# Patient Record
Sex: Female | Born: 1980 | Race: Black or African American | Hispanic: No | Smoking: Former smoker
Health system: Southern US, Community
[De-identification: ages and names within clinical notes are randomized; demographics above are authoritative.]

## PROBLEM LIST (undated history)

## (undated) DIAGNOSIS — B3731 Acute candidiasis of vulva and vagina: Secondary | ICD-10-CM

## (undated) DIAGNOSIS — A749 Chlamydial infection, unspecified: Secondary | ICD-10-CM

## (undated) DIAGNOSIS — D219 Benign neoplasm of connective and other soft tissue, unspecified: Secondary | ICD-10-CM

## (undated) DIAGNOSIS — R1900 Intra-abdominal and pelvic swelling, mass and lump, unspecified site: Secondary | ICD-10-CM

## (undated) DIAGNOSIS — B373 Candidiasis of vulva and vagina: Secondary | ICD-10-CM

## (undated) DIAGNOSIS — B9689 Other specified bacterial agents as the cause of diseases classified elsewhere: Secondary | ICD-10-CM

## (undated) DIAGNOSIS — N760 Acute vaginitis: Secondary | ICD-10-CM

## (undated) DIAGNOSIS — Z8489 Family history of other specified conditions: Secondary | ICD-10-CM

## (undated) HISTORY — PX: TUBAL LIGATION: SHX77

---
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
## 1999-03-22 ENCOUNTER — Encounter: Admission: RE | Admit: 1999-03-22 | Discharge: 1999-03-22 | Payer: Self-pay | Admitting: Family Medicine

## 1999-06-16 ENCOUNTER — Encounter: Admission: RE | Admit: 1999-06-16 | Discharge: 1999-06-16 | Payer: Self-pay | Admitting: Family Medicine

## 1999-06-30 ENCOUNTER — Encounter: Admission: RE | Admit: 1999-06-30 | Discharge: 1999-06-30 | Payer: Self-pay | Admitting: Family Medicine

## 2000-04-19 ENCOUNTER — Inpatient Hospital Stay (HOSPITAL_COMMUNITY): Admission: AD | Admit: 2000-04-19 | Discharge: 2000-04-19 | Payer: Self-pay | Admitting: *Deleted

## 2000-06-24 ENCOUNTER — Ambulatory Visit (HOSPITAL_COMMUNITY): Admission: RE | Admit: 2000-06-24 | Discharge: 2000-06-24 | Payer: Self-pay | Admitting: *Deleted

## 2000-11-23 ENCOUNTER — Inpatient Hospital Stay (HOSPITAL_COMMUNITY): Admission: AD | Admit: 2000-11-23 | Discharge: 2000-11-23 | Payer: Self-pay | Admitting: *Deleted

## 2000-11-24 ENCOUNTER — Inpatient Hospital Stay (HOSPITAL_COMMUNITY): Admission: AD | Admit: 2000-11-24 | Discharge: 2000-11-27 | Payer: Self-pay | Admitting: *Deleted

## 2001-08-12 ENCOUNTER — Encounter: Admission: RE | Admit: 2001-08-12 | Discharge: 2001-08-12 | Payer: Self-pay | Admitting: Family Medicine

## 2002-01-30 ENCOUNTER — Encounter: Admission: RE | Admit: 2002-01-30 | Discharge: 2002-01-30 | Payer: Self-pay | Admitting: Family Medicine

## 2002-10-24 ENCOUNTER — Encounter (INDEPENDENT_AMBULATORY_CARE_PROVIDER_SITE_OTHER): Payer: Self-pay | Admitting: *Deleted

## 2002-10-29 ENCOUNTER — Other Ambulatory Visit: Admission: RE | Admit: 2002-10-29 | Discharge: 2002-10-29 | Payer: Self-pay | Admitting: Family Medicine

## 2002-10-29 ENCOUNTER — Encounter: Admission: RE | Admit: 2002-10-29 | Discharge: 2002-10-29 | Payer: Self-pay | Admitting: Family Medicine

## 2003-08-17 ENCOUNTER — Other Ambulatory Visit: Admission: RE | Admit: 2003-08-17 | Discharge: 2003-08-17 | Payer: Self-pay | Admitting: Obstetrics and Gynecology

## 2003-08-18 ENCOUNTER — Other Ambulatory Visit: Admission: RE | Admit: 2003-08-18 | Discharge: 2003-08-18 | Payer: Self-pay | Admitting: Obstetrics and Gynecology

## 2004-01-20 ENCOUNTER — Inpatient Hospital Stay (HOSPITAL_COMMUNITY): Admission: AD | Admit: 2004-01-20 | Discharge: 2004-01-20 | Payer: Self-pay | Admitting: Obstetrics and Gynecology

## 2004-02-19 ENCOUNTER — Inpatient Hospital Stay (HOSPITAL_COMMUNITY): Admission: AD | Admit: 2004-02-19 | Discharge: 2004-02-20 | Payer: Self-pay | Admitting: Obstetrics and Gynecology

## 2004-03-03 ENCOUNTER — Inpatient Hospital Stay (HOSPITAL_COMMUNITY): Admission: AD | Admit: 2004-03-03 | Discharge: 2004-03-03 | Payer: Self-pay | Admitting: Obstetrics & Gynecology

## 2004-03-28 ENCOUNTER — Inpatient Hospital Stay (HOSPITAL_COMMUNITY): Admission: AD | Admit: 2004-03-28 | Discharge: 2004-03-28 | Payer: Self-pay | Admitting: Obstetrics and Gynecology

## 2004-04-10 ENCOUNTER — Inpatient Hospital Stay (HOSPITAL_COMMUNITY): Admission: RE | Admit: 2004-04-10 | Discharge: 2004-04-13 | Payer: Self-pay | Admitting: Obstetrics and Gynecology

## 2005-04-17 ENCOUNTER — Other Ambulatory Visit: Admission: RE | Admit: 2005-04-17 | Discharge: 2005-04-17 | Payer: Self-pay | Admitting: Obstetrics and Gynecology

## 2005-04-18 ENCOUNTER — Other Ambulatory Visit: Admission: RE | Admit: 2005-04-18 | Discharge: 2005-04-18 | Payer: Self-pay | Admitting: Obstetrics and Gynecology

## 2005-11-17 ENCOUNTER — Inpatient Hospital Stay (HOSPITAL_COMMUNITY): Admission: AD | Admit: 2005-11-17 | Discharge: 2005-11-17 | Payer: Self-pay | Admitting: Obstetrics and Gynecology

## 2005-11-19 ENCOUNTER — Inpatient Hospital Stay (HOSPITAL_COMMUNITY): Admission: AD | Admit: 2005-11-19 | Discharge: 2005-11-19 | Payer: Self-pay | Admitting: Obstetrics and Gynecology

## 2005-11-19 ENCOUNTER — Inpatient Hospital Stay (HOSPITAL_COMMUNITY): Admission: AD | Admit: 2005-11-19 | Discharge: 2005-11-21 | Payer: Self-pay | Admitting: Obstetrics and Gynecology

## 2006-01-01 ENCOUNTER — Ambulatory Visit (HOSPITAL_COMMUNITY): Admission: RE | Admit: 2006-01-01 | Discharge: 2006-01-01 | Payer: Self-pay | Admitting: Obstetrics and Gynecology

## 2006-01-07 ENCOUNTER — Encounter (INDEPENDENT_AMBULATORY_CARE_PROVIDER_SITE_OTHER): Payer: Self-pay | Admitting: *Deleted

## 2006-01-07 ENCOUNTER — Ambulatory Visit (HOSPITAL_COMMUNITY): Admission: RE | Admit: 2006-01-07 | Discharge: 2006-01-07 | Payer: Self-pay | Admitting: Obstetrics and Gynecology

## 2006-11-09 ENCOUNTER — Emergency Department (HOSPITAL_COMMUNITY): Admission: EM | Admit: 2006-11-09 | Discharge: 2006-11-09 | Payer: Self-pay | Admitting: Emergency Medicine

## 2007-02-20 DIAGNOSIS — E669 Obesity, unspecified: Secondary | ICD-10-CM

## 2007-02-21 ENCOUNTER — Encounter (INDEPENDENT_AMBULATORY_CARE_PROVIDER_SITE_OTHER): Payer: Self-pay | Admitting: *Deleted

## 2008-05-30 ENCOUNTER — Inpatient Hospital Stay (HOSPITAL_COMMUNITY): Admission: AD | Admit: 2008-05-30 | Discharge: 2008-05-30 | Payer: Self-pay | Admitting: Obstetrics and Gynecology

## 2008-09-09 ENCOUNTER — Emergency Department (HOSPITAL_COMMUNITY): Admission: EM | Admit: 2008-09-09 | Discharge: 2008-09-09 | Payer: Self-pay | Admitting: Family Medicine

## 2009-05-26 ENCOUNTER — Emergency Department (HOSPITAL_COMMUNITY): Admission: EM | Admit: 2009-05-26 | Discharge: 2009-05-26 | Payer: Self-pay | Admitting: Emergency Medicine

## 2009-10-13 ENCOUNTER — Emergency Department (HOSPITAL_COMMUNITY): Admission: EM | Admit: 2009-10-13 | Discharge: 2009-10-13 | Payer: Self-pay | Admitting: Emergency Medicine

## 2010-06-28 ENCOUNTER — Emergency Department (HOSPITAL_COMMUNITY): Admission: EM | Admit: 2010-06-28 | Discharge: 2010-06-28 | Payer: Self-pay | Admitting: Emergency Medicine

## 2010-07-19 ENCOUNTER — Emergency Department (HOSPITAL_COMMUNITY): Admission: EM | Admit: 2010-07-19 | Discharge: 2010-07-19 | Payer: Self-pay | Admitting: Emergency Medicine

## 2011-03-10 LAB — URINALYSIS, ROUTINE W REFLEX MICROSCOPIC
Bilirubin Urine: NEGATIVE
Glucose, UA: NEGATIVE mg/dL
Nitrite: NEGATIVE
Specific Gravity, Urine: 1.022 (ref 1.005–1.030)

## 2011-03-10 LAB — COMPREHENSIVE METABOLIC PANEL
AST: 30 U/L (ref 0–37)
Creatinine, Ser: 0.74 mg/dL (ref 0.4–1.2)
GFR calc Af Amer: >60 mL/min (ref >60)
GFR calc non Af Amer: >60 mL/min (ref >60)
Potassium: 3.3 mEq/L — ABNORMAL LOW (ref 3.5–5.1)
Sodium: 141 mEq/L (ref 135–145)
Total Protein: 6.9 g/dL (ref 6.0–8.3)

## 2011-03-10 LAB — CBC
HCT: 36.5 % (ref 36.0–46.0)
MCH: 31.1 pg (ref 26.0–34.0)
Platelets: 188 10*3/uL (ref 150–400)
RBC: 4.03 MIL/uL (ref 3.87–5.11)
WBC: 10.1 10*3/uL (ref 4.0–10.5)

## 2011-03-10 LAB — DIFFERENTIAL
Basophils Absolute: 0 10*3/uL (ref 0.0–0.1)
Basophils Relative: 0 % (ref 0–1)
Eosinophils Relative: 3 % (ref 0–5)
Lymphs Abs: 1.9 10*3/uL (ref 0.7–4.0)
Monocytes Absolute: 0.8 10*3/uL (ref 0.1–1.0)
Neutro Abs: 7.1 10*3/uL (ref 1.7–7.7)
Neutrophils Relative %: 71 % (ref 43–77)

## 2011-03-10 LAB — GC/CHLAMYDIA PROBE AMP, GENITAL
Chlamydia, DNA Probe: NEGATIVE
GC Probe Amp, Genital: NEGATIVE

## 2011-03-10 LAB — URINE MICROSCOPIC-ADD ON

## 2011-03-10 LAB — URINE CULTURE: Culture: NO GROWTH

## 2011-03-10 LAB — LIPASE, BLOOD: Lipase: 23 U/L (ref 11–59)

## 2011-03-10 LAB — WET PREP, GENITAL: Trich, Wet Prep: NONE SEEN

## 2011-05-11 NOTE — Op Note (Signed)
NAME:  Anna Schroeder, Anna Schroeder NO.:  [id number]   MEDICAL RECORD NO.:  [id number]          PATIENT TYPE:  AMB   LOCATION:  SDC                           [facility]ILITY:  [facility]   PHYSICIAN:  Malva Limes, M.D.    DATE OF BIRTH:  1981/08/09   DATE OF PROCEDURE:  01/07/2006  DATE OF DISCHARGE:                                 OPERATIVE REPORT   PREOPERATIVE DIAGNOSES:  1.  The patient desires permanent sterilization.  2.  Right ovarian dermoid cyst.   POSTOPERATIVE DIAGNOSES:  1.  The patient desires permanent sterilization.  2.  Right ovarian dermoid cyst.  3.  Omental adhesions.   SURGEON:  Malva Limes, M.D.   ASSISTANT:  Luvenia Redden, M.D.   ANTIBIOTICS:  Ancef 1 g.   ESTIMATED BLOOD LOSS:  Minimal.   SPECIMENS:  Right ovary dermoid sent to pathology.   COMPLICATIONS:  None.   DRAINS:  Red rubber catheter to bladder.   DESCRIPTION OF PROCEDURE:  The patient was taken to the operating room where  she was placed in the dorsal supine position.  A general anesthetic was  administered without complications.  She was then placed in the dorsal  lithotomy position. She was prepped with Betadine.  Her bladder was drained  with a red rubber catheter.  A Hulka tenaculum was applied to the anterior  cervical lip.  The umbilicus was then injected with 0.25% Marcaine.  A  vertical skin incision was made.  This was carried down to the fascia.  The  fascia was grasped with Kochers and opened with the Mayo scissors.  Parietal  peritoneum was entered sharply.  Sutures were placed laterally.  The Hasson  cannula placed in the peritoneal cavity and  3 L of carbon dioxide  insufflated.  The patient was then placed in Trendelenburg.  At this point a  5 mm port was placed in the suprapubic region under direct visualization and  also in the right lower quadrant under direct visualization.  The patient  was noted to have omental adhesions involving the uterus and anterior  abdominal  wall.  This was all taken down sharply.  Once this was  accomplished, examination of the fallopian tubes and ovaries was undertaken.  The patient was found to have 3 cm dermoid on the right ovary.  There was no  evidence of any endometriosis in the pelvis.  Uterus and fallopian tubes  were appeared to be normal.  Left ovary was normal.  At this point the  Filshie clips were set up and placed in the isthmic portion of both  fallopian tubes.  The clip was applied perpendicularly to the tube.  The  entire tube appeared to be within the clasp.  The clasp appeared to be  tightly closed.  At this point the right ovary was grasped and the serosa  opened.  The ovarian dermoid was shot out at the last moment.  The cyst was  entered and serous fluid was noted.  Suction device was placed into the cyst  and the contents withdrawn.  A hair  was seen.  The cyst was removed through  the 10 mm scope port.  At this point copious irrigation was performed.  There was no evidence of any current bleeding.  At this point the procedure  was concluded.  The patient instruments were removed.  The pneumoperitoneum  was released.  No evidence of bleeding was seen from the port sites.  The 5  mm ports were closed with Dermabond.  The umbilical port was closed with an  0 Vicryl suture and 4-0 Vicryl suture.  The patient tolerated the procedure  well.  She was taken to the recovery room in stable condition.  Instrument  and lap counts were correct x1.   The patient will be discharged to home.  She was given Percocet to take  p.r.n.Marland Kitchen  She will follow up in the office in four weeks.           ______________________________  Malva Limes, M.D.     MA/MEDQ  D:  01/07/2006  T:  01/08/2006  Job:  [id number]

## 2011-05-11 NOTE — Discharge Summary (Signed)
NAME:  Anna Schroeder, Anna Schroeder                            ACCOUNT NO.:  [id number]   MEDICAL RECORD NO.:  [id number]                   PATIENT TYPE:  INP   LOCATION:  9126                                 [facility]ILITY[facility]   PHYSICIAN:  Miguel Aschoff, M.D.                    DATE OF BIRTH:  07-16-1981   DATE OF ADMISSION:  04/10/2004  DATE OF DISCHARGE:  04/13/2004                                 DISCHARGE SUMMARY   FINAL DIAGNOSES:  1. Intrauterine pregnancy at [redacted] weeks gestation.  2. Breech presentation.   PROCEDURE:  Primary low transverse cesarean section.   SURGEON:  Dr. Carrington Clamp.   ASSISTANT:  Dr. Conley Simmonds.   COMPLICATIONS:  None.   This 30 year old G2 P1-0-0-1 presents at term with a breech presentation.  The patient's antepartum course had been complicated by the patient with  sickle cell trait, the patient's first child has sickle cell disease and  unsure of father of the baby, therefore we are uncertain of the father-of-  the-baby's sickle cell status.  Otherwise, the patient's antepartum course  had been uncomplicated.  She was seen at 32 weeks at the [facility]  for some preterm uterine contractions and was given a shot of terbutaline  but did not have any preterm contractions after that point.  The patient's  group B strep culture was negative.  She is admitted at the hospital at this  time.  She was taken to the operating room by Dr. Conley Simmonds where a  primary low transverse cesarean section was performed with the delivery of a  7-pound 4-ounce female infant in the footling breech presentation.  The baby  had Apgars of 8 and 8.  The baby was sent to the nursery secondary to some  fluid in the pharynx.  Otherwise, the delivery went without complications.  The patient's postoperative course was benign without complications.  The  patient was felt ready for discharge on postoperative day #3.  She was sent  home on a regular diet, told to decrease activities, told to  continue her  Chromagen iron one daily, was given Tylox one to two q.4h. as needed for  pain, told to follow up in the office in 4-6 weeks.   LABORATORY DATA ON DISCHARGE:  The patient had a hemoglobin of 9.6, white  blood cell count 13.3.     Leilani Able, P.A.-C.                Miguel Aschoff, M.D.    MB/MEDQ  D:  05/08/2004  T:  05/08/2004  Job:  [id number]

## 2011-05-11 NOTE — Op Note (Signed)
NAME:  Anna Schroeder, Anna Schroeder NO.:  [id number]   MEDICAL RECORD NO.:  [id number]          PATIENT TYPE:  INP   LOCATION:  9135                          [facility]   PHYSICIAN:  Miguel Aschoff, M.D.       DATE OF BIRTH:  1980-12-27   DATE OF PROCEDURE:  11/20/2005  DATE OF DISCHARGE:                                 OPERATIVE REPORT   PREOPERATIVE DIAGNOSIS:  Desired sterilization.   POSTOPERATIVE DIAGNOSIS:  Failed attempt at postpartum tubal sterilization  secondary to omental adhesions, intraperitoneal fluid and fat.   SURGEON:  Miguel Aschoff, M.D.   ANESTHESIA:  Epidural.   BRIEF HISTORY:  The patient is a 30 year old black female.  She delivered on  November 19, 2005.  The patient has had one previous cesarean section one  and two normal deliveries.  The patient has expressed the desire for a  permanent sterilization procedure and signed all the appropriate consents  for postpartum tubal sterilization.  After informed consent, she was taken  to the operating room to have this procedure done.   PROCEDURE:  After satisfactory level of anesthesia was achieved, the patient  was placed in supine position, prepped and draped in the usual fashion.  An  infraumbilical incision was made.  This incision was extended down through  the subcutaneous tissue until the fascia was identified.  The fascia was  then incised transversely, revealing the peritoneum.  The peritoneum was  then entered.  There was a large volume of intraperitoneal fluid, which kept  seeping through the infraumbilical incision.  It was necessary to use pool  suction to evacuate this fluid in an attempt to allow visualization.  Using  multiple retractors, it was still difficult to bring the adnexa into view.  There was a band of adhesions covering the anterior surface of the uterus,  making this difficult.  The attempt continued on multiple tries but it was  never possible to visualize either tube or grasp  either tube and bring it  through the infraumbilical incision.  After approximately 20 minutes of  trying, still with the omentum and fluid interfering with the ability to  perform this procedure, it was elected to abandon this attempt and schedule  the patient for an outpatient laparoscopic tubal in the future.  The patient  was informed and agreed to cessation of the procedure at this point.  The  fascia was identified, grasped with Allis clamps and closed using running  interlocking 0 Vicryl suture.  The subcutaneous tissue was closed using  interrupted 0 Vicryl suture.  The skin incision was closed using  subcuticular 0 Vicryl suture, and then the site was injected with 0.25%  Marcaine, 10 mL were used.   Plan is for the patient be taken to the recovery room and returned to her  room.  She will be seen back in six weeks for postpartum check and at that  time scheduled for a laparoscopic tubal sterilization      Miguel Aschoff, M.D.  Electronically Signed    AR/MEDQ  D:  11/20/2005  T:  11/20/2005  Job:  [id number]

## 2011-05-11 NOTE — Op Note (Signed)
NAME:  Anna Schroeder, Anna Schroeder                            ACCOUNT NO.:  [id number]   MEDICAL RECORD NO.:  [id number]                   PATIENT TYPE:  INP   LOCATION:  9126                                 [facility]   PHYSICIAN:  Carrington Clamp, M.D.              DATE OF BIRTH:  1981/07/01   DATE OF PROCEDURE:  04/10/2004  DATE OF DISCHARGE:                                 OPERATIVE REPORT   PREOPERATIVE DIAGNOSIS:  Breech presentation at term pregnancy.   POSTOPERATIVE DIAGNOSIS:  Breech presentation at term pregnancy.   PROCEDURE:  Primary low transverse cesarean section.   SURGEON:  Carrington Clamp, M.D.   ASSISTANT:  Randye Lobo, M.D.   ANESTHESIA:  Spinal.   ESTIMATED BLOOD LOSS:  700 cc.   URINE OUTPUT:  200 cc.   INTRAVENOUS FLUIDS:  3400 cc.   COMPLICATIONS:  None.   FINDINGS:  1. A female infant, footling breech presentation, Apgars 8 and 8.  The baby     was sent to the nursery secondary to some fluid in the pharynx.  2. There were normal tubes, ovaries, and uterus seen.   MEDICATIONS:  Ancef, Pitocin, and __________ .   PATHOLOGY:  None.   TECHNIQUE:  After adequate spinal anesthesia was achieved, the patient was  prepped and draped in the usual sterile fashion in the dorsal supine  position with leftward tilt.  A Pfannenstiel skin incision was made with a  scalpel and carried down to the fascia with the Bovie cautery.  The fascia  was incised in the midline with the scalpel and carried in a transverse  curvilinear manner with the Mayo scissors.  The fascia was reflected  superiorly and inferiorly from the rectus muscle, and the rectus muscle was  split in the midline.  The peritoneum was entered bluntly, and the  peritoneum was then incised in the superior and inferior manner with the  Metzenbaum scissors.   The bladder blade was placed, and the vesicouterine fascia tented up and  incised in a transverse curvilinear to create the bladder flap with blunt  dissection.  The bladder blade was replaced and a 2-cm incision was made in  the upper portion of the lower uterine segment transversely until the amnion  could be seen.  The bandage scissors were used to extend the incision in a  transverse curvilinear manner, and Allis clamps were used to rupture the  membranes.  The baby was identified in the breech presentation and delivered  without complication.  The baby was bulb suctioned, and the cord was clamped  and cut.  The baby was handed to awaiting pediatrics.  The placenta was  delivered manually and the uterus exteriorized, wrapped in a wet lap, and  cleared of all debris.  The uterine incision was closed with a running  locked stitch of 0 Monocryl.  An imbricating layer of 0 Monocryl was then  used.  Two figure-of-eight stitches were then used to ensure hemostasis.  The uterus was reapproximated in the abdomen, and the abdomen was irrigated  with saline.  The uterine incision was re-inspected and found to be  hemostatic.  There was one other additional stitch done to ensure  hemostasis.  The peritoneum was then closed with a running stitch of 2-0  Vicryl.  The fascia was closed with a running stitch of 0 Vicryl.  The  subcutaneous tissue was  rendered hemostatic with Bovie cautery and  irrigation, then closed with interrupted stitches of 2-0 plain gut.  The  skin was closed with staples.  The patient tolerated the procedure well and  was returned to the recovery room in stable condition.                                               Carrington Clamp, M.D.    MH/MEDQ  D:  04/10/2004  T:  04/10/2004  Job:  [id number]

## 2011-09-20 LAB — POCT PREGNANCY, URINE: Operator id: 13440

## 2012-03-09 ENCOUNTER — Emergency Department (INDEPENDENT_AMBULATORY_CARE_PROVIDER_SITE_OTHER)
Admission: EM | Admit: 2012-03-09 | Discharge: 2012-03-09 | Disposition: A | Discharge status: Home / Self Care | Payer: Self-pay | Source: Home / Self Care | Attending: Emergency Medicine | Admitting: Emergency Medicine

## 2012-03-09 ENCOUNTER — Encounter (HOSPITAL_COMMUNITY): Payer: Self-pay | Admitting: Emergency Medicine

## 2012-03-09 DIAGNOSIS — N72 Inflammatory disease of cervix uteri: Secondary | ICD-10-CM

## 2012-03-09 HISTORY — DX: Chlamydial infection, unspecified: A74.9

## 2012-03-09 HISTORY — DX: Other specified bacterial agents as the cause of diseases classified elsewhere: N76.0

## 2012-03-09 HISTORY — DX: Acute candidiasis of vulva and vagina: B37.31

## 2012-03-09 HISTORY — DX: Candidiasis of vulva and vagina: B37.3

## 2012-03-09 HISTORY — DX: Other specified bacterial agents as the cause of diseases classified elsewhere: B96.89

## 2012-03-09 HISTORY — DX: Acute vaginitis: N76.0

## 2012-03-09 HISTORY — DX: Benign neoplasm of connective and other soft tissue, unspecified: D21.9

## 2012-03-09 LAB — POCT URINALYSIS DIP (DEVICE)
Hgb urine dipstick: NEGATIVE
Specific Gravity, Urine: >=1.03 (ref 1.005–1.030)
pH: 5.5 (ref 5.0–8.0)

## 2012-03-09 LAB — WET PREP, GENITAL

## 2012-03-09 MED ORDER — CEFTRIAXONE SODIUM 250 MG IJ SOLR
INTRAMUSCULAR | Status: AC
  Filled 2012-03-09: qty 250

## 2012-03-09 MED ORDER — LIDOCAINE HCL (PF) 1 % IJ SOLN
INTRAMUSCULAR | Status: AC
  Filled 2012-03-09: qty 5

## 2012-03-09 MED ORDER — AZITHROMYCIN 250 MG PO TABS
ORAL_TABLET | ORAL | Status: AC
  Filled 2012-03-09: qty 4

## 2012-03-09 MED ORDER — AZITHROMYCIN 250 MG PO TABS
1000.0000 mg | ORAL_TABLET | Freq: Once | ORAL | Status: AC
  Administered 2012-03-09: 1000 mg via ORAL

## 2012-03-09 MED ORDER — NAPROXEN 500 MG PO TABS: 500.0000 mg | ORAL_TABLET | Freq: Two times a day (BID) | ORAL | Status: DC

## 2012-03-09 MED ORDER — CEFTRIAXONE SODIUM 250 MG IJ SOLR
250.0000 mg | Freq: Once | INTRAMUSCULAR | Status: AC
  Administered 2012-03-09: 250 mg via INTRAMUSCULAR

## 2012-03-09 NOTE — ED Notes (Signed)
Pt has low abd pain, low back pain and white vaginal discharge for 4 days.

## 2012-03-09 NOTE — ED Provider Notes (Signed)
History     CSN: [id number]  Arrival date & time 03/09/12  9147   First MD Initiated Contact with Patient 03/09/12 [phone]      Chief Complaint  Patient presents with  . Abdominal Pain    (Consider location/radiation/quality/duration/timing/severity/associated sxs/prior treatment) HPI Comments: Pt with 1 week nonoderous vaginal discharge. Patient also reports sharp, constant midline and left lower quadrant pain 5 days ago. States that the pain was constant on the first day, and has gradually gotten better,  Pain was not related with movement, eating, fasting, urination, defecation. Reports some vaginal spotting, and occasional back pain.. Patient's menses were from March 6 to the 10th, and she states that the irregular vaginal bleeding is unusual for her. No urgency, frequency, dysuria, oderous urine, hematuria,  genital blisters, vaginal itching. No fevers, N/V, other abdominal pain. No recent abx use. Pt sexually active with same female partner. Does not know if he is having any symptoms.STD's  a concern today. Has a history of BV, chlamydia, yeast infection. History of left ovarian cyst, uterine fibroids. No h/o gonorrhea, Trichomonas, syphilis, herpes, HIV.   ROS as noted in HPI. All other ROS negative.    Patient is a 31 y.o. female presenting with vaginal discharge. The history is provided by the patient. No language interpreter was used.  Vaginal Discharge This is a new problem. The current episode started more than 1 week ago. The problem occurs constantly. The problem has been gradually worsening. Associated symptoms include abdominal pain. The symptoms are aggravated by intercourse. The symptoms are relieved by NSAIDs. Treatments tried: Aleve. The treatment provided mild relief.    Past Medical History  Diagnosis Date  . Chlamydia   . Fibroids   . BV (bacterial vaginosis)   . Vaginal yeast infection     Past Surgical History  Procedure Date  . Tubal ligation     History  reviewed. No pertinent family history.  History  Substance Use Topics  . Smoking status: Current Everyday Smoker -- 0.5 packs/day  . Smokeless tobacco: Not on file  . Alcohol Use: No    OB History    Grav Para Term Preterm Abortions TAB SAB Ect Mult Living                  Review of Systems  Gastrointestinal: Positive for abdominal pain.  Genitourinary: Positive for vaginal discharge.    Allergies  Review of patient's allergies indicates no known allergies.  Home Medications   Current Outpatient Rx  Name Route Sig Dispense Refill  . NAPROXEN SODIUM 220 MG PO TABS Oral Take 220 mg by mouth 2 (two) times daily with a meal.    . NAPROXEN 500 MG PO TABS Oral Take 1 tablet (500 mg total) by mouth 2 (two) times daily. 20 tablet 0    BP 135/75  Pulse 85  Temp(Src) 98.7 F (37.1 C) (Oral)  Resp 16  SpO2 100%  LMP 02/27/2012  Physical Exam  Nursing note and vitals reviewed. Constitutional: She is oriented to person, place, and time. She appears well-developed and well-nourished. No distress.  HENT:  Head: Normocephalic and atraumatic.  Eyes: Conjunctivae and EOM are normal. No scleral icterus.  Neck: Normal range of motion.  Cardiovascular: Normal rate, regular rhythm and normal heart sounds.   Pulmonary/Chest: Effort normal and breath sounds normal.  Abdominal: Soft. Normal appearance and bowel sounds are normal. She exhibits no distension. There is no hepatomegaly. There is tenderness in the suprapubic area. There is no  rebound, no guarding and no CVA tenderness.  Genitourinary: Pelvic exam was performed with patient supine. There is no rash on the right labia. There is no rash on the left labia. Uterus is tender. Cervix exhibits no motion tenderness and no friability. Right adnexum displays no mass, no tenderness and no fullness. Left adnexum displays no mass and no fullness. No erythema, tenderness or bleeding around the vagina. No foreign body around the vagina. Vaginal  discharge found.       Thin white non-oderous  vaginal d/c. Uterine tenderness, mild left adnexal tenderness. Chaperone present during exam  Musculoskeletal: Normal range of motion.  Neurological: She is alert and oriented to person, place, and time.  Skin: Skin is warm and dry.  Psychiatric: She has a normal mood and affect. Her behavior is normal. Judgment and thought content normal.    ED Course  Procedures (including critical care time)  Labs Reviewed  POCT URINALYSIS DIP (DEVICE) - Abnormal; Notable for the following:    Bilirubin Urine MODERATE (*)    Ketones, ur TRACE (*)    Protein, ur 30 (*)    All other components within normal limits  WET PREP, GENITAL - Abnormal; Notable for the following:    Clue Cells Wet Prep HPF POC FEW (*)    WBC, Wet Prep HPF POC FEW (*)    All other components within normal limits  POCT PREGNANCY, URINE  GC/CHLAMYDIA PROBE AMP, GENITAL  LAB REPORT - SCANNED   No results found.   1. Cervicitis     Results for orders placed during the hospital encounter of 03/09/12  POCT URINALYSIS DIP (DEVICE)      Component Value Range   Glucose, UA NEGATIVE  NEGATIVE (mg/dL)   Bilirubin Urine MODERATE (*) NEGATIVE    Ketones, ur TRACE (*) NEGATIVE (mg/dL)   Specific Gravity, Urine >=1.030  1.005 - 1.030    Hgb urine dipstick NEGATIVE  NEGATIVE    pH 5.5  5.0 - 8.0    Protein, ur 30 (*) NEGATIVE (mg/dL)   Urobilinogen, UA 1.0  0.0 - 1.0 (mg/dL)   Nitrite NEGATIVE  NEGATIVE    Leukocytes, UA NEGATIVE  NEGATIVE   POCT PREGNANCY, URINE      Component Value Range   Preg Test, Ur NEGATIVE  NEGATIVE   WET PREP, GENITAL      Component Value Range   Yeast Wet Prep HPF POC NONE SEEN  NONE SEEN    Trich, Wet Prep NONE SEEN  NONE SEEN    Clue Cells Wet Prep HPF POC FEW (*) NONE SEEN    WBC, Wet Prep HPF POC FEW (*) NONE SEEN   GC/CHLAMYDIA PROBE AMP, GENITAL      Component Value Range   GC Probe Amp, Genital NEGATIVE  NEGATIVE    Chlamydia, DNA Probe  NEGATIVE  NEGATIVE      MDM  Previous charts, labs, imaging reviewed. Patient was last seen in the ER on 07/19/2010 for left lower pelvic pain. Pelvic ultrasound confirmed uterine fibroid. Patient noted to have a left ovarian cyst. Patient diagnosed with abdominal pain, bacterial vaginosis, uterine fibroids.  Udip noted. Patient's urine is extremely concentrated, and also has trace ketones, protein with this. Patient with no left upper quadrant pain, upper abdominal tenderness, jaundice, history of hepatitis, history of hemolytic anemias, recent travel. Discussed this result with patient, and will have her followup with a primary care physician of her choice to have this rechecked. Providing her with local primary  care resources. H&P most consistent with a cervicitis, not PID, ovarian torsion. Giving 250 mg of Rocephin and 1 g of azithromycin  as STDs are a concern today. Sent off GC, Chlamydia, wet prep. Advised patient to refrain from sexual contact and  to give [place] a working phone number so that we can contact her if needed  Luiz Blare, MD 03/12/12 1700

## 2012-03-09 NOTE — Discharge Instructions (Signed)
Take the medication as written. Give [place] a working phone number so that we can contact you if needed. Refrain from sexual contact until you know your results and your partner(s) are treated. Return if you get worse, have a fever >100.4, or for any concerns.   Go to www.goodrx.com to look up your medications. This will give you a list of where you can find your prescriptions at the most affordable prices.  Sheila Oats GUIDE  Insufficient Money for Medicine Contact [employer]:  call "211" or [facility] [phone].  No Primary Care Doctor Call [facility]  [phone] Other agencies that provide inexpensive medical care    [facility]  [phone]    [facility]  [phone]    [facility]  [phone]    [facility]  [phone] [address] [place] [address]    [facility]  [phone]    [facility]  [phone] [facility] [phone]   [address], [place] [place], [place] [address]  [facility] Resources  [facility]     [employer]                          [facility] Dept. [address] [place]                       [address]      [address] [place] [address]   [phone] (After Hours)

## 2012-03-10 LAB — GC/CHLAMYDIA PROBE AMP, GENITAL
Chlamydia, DNA Probe: NEGATIVE
GC Probe Amp, Genital: NEGATIVE

## 2012-11-13 ENCOUNTER — Encounter (HOSPITAL_COMMUNITY): Payer: Self-pay | Admitting: *Deleted

## 2012-11-13 ENCOUNTER — Other Ambulatory Visit (HOSPITAL_COMMUNITY)
Admission: RE | Admit: 2012-11-13 | Discharge: 2012-11-13 | Disposition: A | Discharge status: Home / Self Care | Payer: Medicaid Other | Source: Ambulatory Visit | Attending: Emergency Medicine | Admitting: Emergency Medicine

## 2012-11-13 ENCOUNTER — Emergency Department (HOSPITAL_COMMUNITY)
Admission: EM | Admit: 2012-11-13 | Discharge: 2012-11-13 | Disposition: A | Discharge status: Home / Self Care | Payer: Medicaid Other | Source: Home / Self Care | Attending: Emergency Medicine | Admitting: Emergency Medicine

## 2012-11-13 DIAGNOSIS — Z113 Encounter for screening for infections with a predominantly sexual mode of transmission: Secondary | ICD-10-CM | POA: Insufficient documentation

## 2012-11-13 DIAGNOSIS — N760 Acute vaginitis: Secondary | ICD-10-CM

## 2012-11-13 DIAGNOSIS — N946 Dysmenorrhea, unspecified: Secondary | ICD-10-CM

## 2012-11-13 MED ORDER — DICLOFENAC SODIUM 75 MG PO TBEC: 75.0000 mg | DELAYED_RELEASE_TABLET | Freq: Every day | ORAL | Status: AC

## 2012-11-13 MED ORDER — FLUCONAZOLE 100 MG PO TABS
100.0000 mg | ORAL_TABLET | Freq: Once | ORAL | Status: DC
Start: 2012-11-13 — End: 2013-10-18

## 2012-11-13 NOTE — ED Provider Notes (Signed)
History     CSN: [id number]  Arrival date & time 11/13/12  1252   First MD Initiated Contact with Patient 11/13/12 1257      Chief Complaint  Patient presents with  . Vaginal Discharge    (Consider location/radiation/quality/duration/timing/severity/associated sxs/prior treatment) HPI Comments: Patient presents urgent care describing she's been having a vaginal discharge for over a month. She was previously diagnosed with a yeast infection but felt that at the time was never so. She has started with her periods and cramping today. She also expressed some concern and she is " crossing her fingers" that her boyfriend has not been " fooling around". Patient denies any fevers, nausea vomiting or constant pelvic pain. Been having some colic pain.  Patient is a 31 y.o. female presenting with vaginal discharge. The history is provided by the patient.  Vaginal Discharge This is a recurrent problem. The current episode started more than 1 week ago. The problem occurs constantly. The problem has not changed since onset.Pertinent negatives include no abdominal pain and no shortness of breath. Nothing aggravates the symptoms. The treatment provided no relief.    Past Medical History  Diagnosis Date  . Chlamydia   . Fibroids   . BV (bacterial vaginosis)   . Vaginal yeast infection     Past Surgical History  Procedure Date  . Tubal ligation     History reviewed. No pertinent family history.  History  Substance Use Topics  . Smoking status: Current Every Day Smoker -- 0.5 packs/day  . Smokeless tobacco: Not on file  . Alcohol Use: No    OB History    Grav Para Term Preterm Abortions TAB SAB Ect Mult Living                  Review of Systems  Respiratory: Negative for shortness of breath.   Gastrointestinal: Negative for nausea, vomiting, abdominal pain and diarrhea.  Genitourinary: Positive for vaginal discharge.  Skin: Negative for rash.    Allergies  Review of patient's  allergies indicates no known allergies.  Home Medications   Current Outpatient Rx  Name  Route  Sig  Dispense  Refill  . DICLOFENAC SODIUM 75 MG PO TBEC   Oral   Take 1 tablet (75 mg total) by mouth daily.   7 tablet   0   . FLUCONAZOLE 100 MG PO TABS   Oral   Take 1 tablet (100 mg total) by mouth once.   2 tablet   0     BP 122/84  Pulse 94  Temp 98.6 F (37 C) (Oral)  Resp 16  SpO2 99%  LMP 10/30/2012  Physical Exam  Nursing note and vitals reviewed. Constitutional: She appears well-developed and well-nourished. No distress.  Pulmonary/Chest: Effort normal.  Abdominal: Soft. She exhibits no distension. There is no tenderness.  Neurological: She is alert.  Skin: No rash noted. No erythema.    ED Course  Procedures (including critical care time)   Labs Reviewed  CERVICOVAGINAL ANCILLARY ONLY   No results found.   1. Vaginitis and vulvovaginitis   2. Menstrual cramps       MDM  Vaginitis- Rx for diflucan        Jimmie Molly, MD 11/13/12 1539

## 2012-11-13 NOTE — ED Notes (Addendum)
Pt  Reports  Vaginal  Discharge   For  Over  Month      She  Reports  Had   A  Yeast  Infection        At  That time  That  She  States  Never  Resolved    She  Also  Reports  Some  Low  abd  Cramping        She  Walks         Upright  With a  Steady  Fluid  Gait          Speaking in  Complete  sentances  Is in no  Acute  distress

## 2012-11-19 ENCOUNTER — Telehealth (HOSPITAL_COMMUNITY): Payer: Self-pay | Admitting: *Deleted

## 2012-11-19 MED ORDER — METRONIDAZOLE 500 MG PO TABS: 500.0000 mg | ORAL_TABLET | Freq: Two times a day (BID) | ORAL | Status: DC

## 2013-04-07 ENCOUNTER — Emergency Department (INDEPENDENT_AMBULATORY_CARE_PROVIDER_SITE_OTHER)
Admission: EM | Admit: 2013-04-07 | Discharge: 2013-04-07 | Disposition: A | Discharge status: Home / Self Care | Payer: Medicaid Other | Source: Home / Self Care | Attending: Emergency Medicine | Admitting: Emergency Medicine

## 2013-04-07 ENCOUNTER — Encounter (HOSPITAL_COMMUNITY): Payer: Self-pay | Admitting: Emergency Medicine

## 2013-04-07 DIAGNOSIS — S335XXA Sprain of ligaments of lumbar spine, initial encounter: Secondary | ICD-10-CM

## 2013-04-07 DIAGNOSIS — S39012A Strain of muscle, fascia and tendon of lower back, initial encounter: Secondary | ICD-10-CM

## 2013-04-07 MED ORDER — KETOROLAC TROMETHAMINE 60 MG/2ML IM SOLN
60.0000 mg | Freq: Once | INTRAMUSCULAR | Status: AC
  Administered 2013-04-07: 60 mg via INTRAMUSCULAR

## 2013-04-07 MED ORDER — HYDROMORPHONE HCL PF 1 MG/ML IJ SOLN
2.0000 mg | Freq: Once | INTRAMUSCULAR | Status: AC
  Administered 2013-04-07: 2 mg via INTRAMUSCULAR

## 2013-04-07 MED ORDER — DICLOFENAC SODIUM 75 MG PO TBEC: 75.0000 mg | DELAYED_RELEASE_TABLET | Freq: Two times a day (BID) | ORAL | Status: DC

## 2013-04-07 MED ORDER — OXYCODONE-ACETAMINOPHEN 5-325 MG PO TABS: ORAL_TABLET | ORAL | Status: DC

## 2013-04-07 MED ORDER — ONDANSETRON 4 MG PO TBDP
ORAL_TABLET | ORAL | Status: AC
  Filled 2013-04-07: qty 2

## 2013-04-07 MED ORDER — CYCLOBENZAPRINE HCL 5 MG PO TABS: 5.0000 mg | ORAL_TABLET | Freq: Three times a day (TID) | ORAL | Status: DC | PRN

## 2013-04-07 MED ORDER — ONDANSETRON 4 MG PO TBDP
8.0000 mg | ORAL_TABLET | Freq: Once | ORAL | Status: AC
  Administered 2013-04-07: 8 mg via ORAL

## 2013-04-07 MED ORDER — HYDROMORPHONE HCL PF 1 MG/ML IJ SOLN
INTRAMUSCULAR | Status: AC
  Filled 2013-04-07: qty 2

## 2013-04-07 MED ORDER — KETOROLAC TROMETHAMINE 60 MG/2ML IM SOLN
INTRAMUSCULAR | Status: AC
  Filled 2013-04-07: qty 2

## 2013-04-07 NOTE — ED Provider Notes (Signed)
Chief Complaint:   Chief Complaint  Patient presents with  . Back Pain    History of Present Illness:   Anna Schroeder is a 32 year old female who has had a three-day history of a severe, excruciating, right lower back pain without radiation. She denies any injury or performing any activity that might have caused this. It does seem to come on its own. The pain does not radiate down the leg and has been no numbness, tingling, or muscle weakness. She denies any dysuria, frequency, hematuria, incontinence of urine, urinary retention, or incontinence of bowel. She's had no abdominal pain, nausea, or vomiting. She denies any fever, chills, headache, stiff neck, unintentional weight loss, or cancer history. She has had no prior history of back problems.  Review of Systems:  Other than noted above, the patient denies any of the following symptoms: Systemic:  No fever, chills, severe fatigue, or unexplained weight loss. GI:  No abdominal pain, nausea, vomiting, diarrhea, constipation, incontinence of bowel, or blood in stool. GU:  No dysuria, frequency, urgency, or hematuria. No incontinence of urine or difficulty urinating.  M-S:  No neck pain, joint pain, arthritis, or myalgias. Neuro:  No paresthesias, saddle anesthesia, muscular weakness, or progressive neurological deficit.  PMFSH:  Past medical history, family history, social history, meds, and allergies were reviewed. Specifically, there is no history of cancer, major trauma, osteoporosis, immunosuppression, HIV, or IV or injection drug use.   Physical Exam:   Vital signs:  BP 129/93  Pulse 67  Temp(Src) 97.9 F (36.6 C) (Oral)  Resp 16  SpO2 100% General:  Alert, oriented, in in severe distress due to her back pain. Abdomen:  Soft, non-tender.  No organomegaly or mass.  No pulsatile midline abdominal mass or bruit. Back:  Right lower back was extremely tender to palpation and she cannot stand to have attached all. Likewise she cannot stand to  move or even sit up straight in a chair. Straight leg raising was positive with pain referred to the back but not down the legs. Neuro:  Normal muscle strength, sensations and DTRs. Extremities: Pedal pulses were full, there was no edema. Skin:  Clear, warm and dry.  No rash.  Course in Urgent Care Center:   She was given Dilaudid 2 mg IM, Toradol 60 mg IM, and Zofran ODT 8 mg sublingually.  Assessment:  The encounter diagnosis was Lumbar strain, initial encounter.  No evidence for HNP, vertebral compression fracture, or arthritis.  Plan:   1.  The following meds were prescribed:   Discharge Medication List as of 04/07/2013  6:47 PM    START taking these medications   Details  cyclobenzaprine (FLEXERIL) 5 MG tablet Take 1 tablet (5 mg total) by mouth 3 (three) times daily as needed for muscle spasms., Starting 04/07/2013, Until Discontinued, Normal    diclofenac (VOLTAREN) 75 MG EC tablet Take 1 tablet (75 mg total) by mouth 2 (two) times daily., Starting 04/07/2013, Until Discontinued, Normal    oxyCODONE-acetaminophen (PERCOCET) 5-325 MG per tablet 1 to 2 tablets every 6 hours as needed for pain., Print       2.  The patient was instructed in symptomatic care and handouts were given. 3.  The patient was told to return if becoming worse in any way, if no better in 2 weeks, and given some red flag symptoms including fever, neurological symptoms, or worsening of the pain that would indicate earlier return. 4.  The patient was encouraged to try to be as  active as possible and given some exercises to do followed by moist heat.    Reuben Likes, MD 04/07/13 2024

## 2013-04-07 NOTE — ED Notes (Signed)
Back pain

## 2013-04-20 NOTE — Telephone Encounter (Signed)
errouneous error

## 2013-10-18 ENCOUNTER — Emergency Department (HOSPITAL_COMMUNITY)
Admission: EM | Admit: 2013-10-18 | Discharge: 2013-10-18 | Disposition: A | Discharge status: Home / Self Care | Payer: Medicaid Other | Source: Home / Self Care | Attending: Family Medicine | Admitting: Family Medicine

## 2013-10-18 ENCOUNTER — Encounter (HOSPITAL_COMMUNITY): Payer: Self-pay | Admitting: Emergency Medicine

## 2013-10-18 DIAGNOSIS — M545 Low back pain: Secondary | ICD-10-CM

## 2013-10-18 MED ORDER — TRAMADOL HCL 50 MG PO TABS: 50.0000 mg | ORAL_TABLET | Freq: Four times a day (QID) | ORAL | Status: DC | PRN

## 2013-10-18 MED ORDER — KETOROLAC TROMETHAMINE 60 MG/2ML IM SOLN
60.0000 mg | Freq: Once | INTRAMUSCULAR | Status: AC
  Administered 2013-10-18: 60 mg via INTRAMUSCULAR

## 2013-10-18 MED ORDER — CYCLOBENZAPRINE HCL 10 MG PO TABS: 10.0000 mg | ORAL_TABLET | Freq: Three times a day (TID) | ORAL | Status: DC | PRN

## 2013-10-18 MED ORDER — KETOROLAC TROMETHAMINE 60 MG/2ML IM SOLN
INTRAMUSCULAR | Status: AC
  Filled 2013-10-18: qty 2

## 2013-10-18 MED ORDER — DICLOFENAC SODIUM 75 MG PO TBEC: 75.0000 mg | DELAYED_RELEASE_TABLET | Freq: Two times a day (BID) | ORAL | Status: DC

## 2013-10-18 MED ORDER — METHYLPREDNISOLONE ACETATE 80 MG/ML IJ SUSP
INTRAMUSCULAR | Status: AC
  Filled 2013-10-18: qty 1

## 2013-10-18 MED ORDER — METHYLPREDNISOLONE ACETATE 80 MG/ML IJ SUSP
80.0000 mg | Freq: Once | INTRAMUSCULAR | Status: AC
  Administered 2013-10-18: 80 mg via INTRAMUSCULAR

## 2013-10-18 NOTE — ED Notes (Signed)
C/O back spasms approx 2-3x/yr.  Was using bathroom yesterday when she felt sudden onset right low back pain.  Area very tender to palpation.  Pain radiates into right buttock only when walking.  Has tried using heating pad.

## 2013-10-18 NOTE — ED Provider Notes (Signed)
Anna Schroeder is a 32 y.o. female who presents to Urgent Care today for right low back pain starting yesterday without injury. Patient denies any radiating pain weakness or numbness. The pain is severe and worse with activity better with rest. She has not tried any medications yet. She denies any difficulty walking or bowel bladder dysfunction. She feels well otherwise.   Past Medical History  Diagnosis Date  . Chlamydia   . Fibroids   . BV (bacterial vaginosis)   . Vaginal yeast infection    History  Substance Use Topics  . Smoking status: Current Every Day Smoker -- 0.50 packs/day  . Smokeless tobacco: Not on file  . Alcohol Use: No   ROS as above Medications reviewed. No current facility-administered medications for this encounter.   Current Outpatient Prescriptions  Medication Sig Dispense Refill  . cyclobenzaprine (FLEXERIL) 10 MG tablet Take 1 tablet (10 mg total) by mouth 3 (three) times daily as needed for muscle spasms.  30 tablet  0  . diclofenac (VOLTAREN) 75 MG EC tablet Take 1 tablet (75 mg total) by mouth 2 (two) times daily.  60 tablet  0  . traMADol (ULTRAM) 50 MG tablet Take 1 tablet (50 mg total) by mouth every 6 (six) hours as needed for pain.  15 tablet  0    Exam:  BP 113/74  Pulse 66  Temp(Src) 98 F (36.7 C) (Oral)  Resp 18  SpO2 100%  LMP 10/15/2013 Gen: Well NAD Back: Nontender to spinal midline. Tender to palpation right SI joint. Range of motion is intact flexion lateral flexion rotation. Pain with extension. Strength is intact bilateral lower extremities. Patient is in the bilateral toes heels and can squat normally, get on and off exam table, and has a normal gait.   sensation is intact bilateral lower extremities.  No results found for this or any previous visit (from the past 24 hour(s)). No results found.  Assessment and Plan: 32 y.o. female with lumbago. Plan to treat with IM injection Toradol and Depo-Medrol. Oral management with  diclofenac, Flexeril, and tramadol.  Heating pad and home exercise program. Followup with sports medicine if not improving. Discussed warning signs or symptoms. Please see discharge instructions. Patient expresses understanding.      Rodolph Bong, MD 10/18/13 [phone]

## 2014-02-04 ENCOUNTER — Encounter (HOSPITAL_COMMUNITY): Payer: Self-pay | Admitting: Emergency Medicine

## 2014-02-04 ENCOUNTER — Emergency Department (HOSPITAL_COMMUNITY)
Admission: EM | Admit: 2014-02-04 | Discharge: 2014-02-04 | Disposition: A | Discharge status: Home / Self Care | Payer: Medicaid Other | Source: Home / Self Care

## 2014-02-04 DIAGNOSIS — R519 Headache, unspecified: Secondary | ICD-10-CM

## 2014-02-04 DIAGNOSIS — M62838 Other muscle spasm: Secondary | ICD-10-CM

## 2014-02-04 DIAGNOSIS — R55 Syncope and collapse: Secondary | ICD-10-CM

## 2014-02-04 DIAGNOSIS — R51 Headache: Secondary | ICD-10-CM

## 2014-02-04 MED ORDER — CYCLOBENZAPRINE HCL 10 MG PO TABS: 10.0000 mg | ORAL_TABLET | Freq: Three times a day (TID) | ORAL | Status: DC | PRN

## 2014-02-04 MED ORDER — MELOXICAM 15 MG PO TABS: 15.0000 mg | ORAL_TABLET | Freq: Every day | ORAL | Status: DC

## 2014-02-04 NOTE — ED Provider Notes (Signed)
Medical screening examination/treatment/procedure(s) were performed by a resident physician or non-physician practitioner and as the supervising physician I was immediately available for consultation/collaboration.  Lynne Leader, MD    Gregor Hams, MD 02/04/14 2112

## 2014-02-04 NOTE — ED Provider Notes (Signed)
CSN: [id number]     Arrival date & time 02/04/14  1539 History   None    Chief Complaint  Patient presents with  . Headache     (Consider location/radiation/quality/duration/timing/severity/associated sxs/prior Treatment) HPI  Headache: started yesterday. Fell Tuesday night. Felt lightheaded Tuesday night after coffee, felt nausaues walked outside adn then passed out. Did not hit head. EMS was called and stated pt was fine. Denies fevers, n/v/d, tongue biting, loss of bowel or bladder function, vertigo, palpitations, CP, SOB. H/o occasional HA every few months. HA is dull, comes and goes. Primarily at back of head. Denies photo/phonophobia. Has not taken anything.    Past Medical History  Diagnosis Date  . Chlamydia   . Fibroids   . BV (bacterial vaginosis)   . Vaginal yeast infection    Past Surgical History  Procedure Laterality Date  . Tubal ligation     History reviewed. No pertinent family history. History  Substance Use Topics  . Smoking status: Current Every Day Smoker -- 0.50 packs/day  . Smokeless tobacco: Not on file  . Alcohol Use: No   OB History   Grav Para Term Preterm Abortions TAB SAB Ect Mult Living                 Review of Systems  Constitutional: Negative for activity change.  Respiratory: Negative for chest tightness, shortness of breath and wheezing.   Cardiovascular: Negative for chest pain and palpitations.  Neurological: Positive for light-headedness and headaches. Negative for dizziness, tremors, seizures, syncope, facial asymmetry, speech difficulty, weakness and numbness.  All other systems reviewed and are negative.      Allergies  Review of patient's allergies indicates no known allergies.  Home Medications   Current Outpatient Rx  Name  Route  Sig  Dispense  Refill  . cyclobenzaprine (FLEXERIL) 10 MG tablet   Oral   Take 1 tablet (10 mg total) by mouth 3 (three) times daily as needed for muscle spasms.   30 tablet   0   .  diclofenac (VOLTAREN) 75 MG EC tablet   Oral   Take 1 tablet (75 mg total) by mouth 2 (two) times daily.   60 tablet   0   . meloxicam (MOBIC) 15 MG tablet   Oral   Take 1 tablet (15 mg total) by mouth daily.   30 tablet   0   . traMADol (ULTRAM) 50 MG tablet   Oral   Take 1 tablet (50 mg total) by mouth every 6 (six) hours as needed for pain.   15 tablet   0    BP 129/84  Pulse 73  Temp(Src) 97.9 F (36.6 C) (Oral)  Resp 20  SpO2 100%  LMP 01/04/2014 Physical Exam  Constitutional: She is oriented to person, place, and time. She appears well-developed and well-nourished. No distress.  HENT:  Head: Normocephalic and atraumatic.  Eyes: EOM are normal. Pupils are equal, round, and reactive to light.  Opthalmic exam w/o evidence of crisp disc margins  Neck: Normal range of motion.  Cardiovascular: Normal rate, regular rhythm, normal heart sounds and intact distal pulses.  Exam reveals no gallop and no friction rub.   No murmur heard. Pulmonary/Chest: Effort normal and breath sounds normal. No respiratory distress. She has no wheezes. She has no rales. She exhibits no tenderness.  Abdominal: Soft. She exhibits no distension.  Musculoskeletal:  Posterior neck muscles ttp but w/ FROM w/ slow movement. No rigidity.   Neurological: She is  alert and oriented to person, place, and time. No cranial nerve deficit. Coordination normal.  Skin: Skin is warm. No rash noted. She is not diaphoretic.  Psychiatric: She has a normal mood and affect. Her behavior is normal. Judgment and thought content normal.    ED Course  Procedures (including critical care time) Labs Review Labs Reviewed - No data to display Imaging Review No results found.    MDM   Final diagnoses:  Headache  Muscle spasm  Syncopal episodes    33yo AAF w/ HA likely secondary to muscle strain from fall.  - Flexeril and meloxicam for HA  Cause of syncope unclear but vasovagal most likely etiology. EKG nml.  No sign of intracranial process. Possibly due to medications/recreational. Unlikely neurologically based - pt to f/u w/ PCP/neuro if episodes continue  Linna Darner, MD Family Medicine PGY-3 02/04/2014, 5:20 PM      Waldemar Dickens, MD 02/04/14 [place], MD 02/04/14 [phone]

## 2014-02-04 NOTE — ED Notes (Signed)
Pt stated that on Tuesday night she got dizzy and fainted. She is having headaches, back spasms, rt arm pain, and neck pain. Pain is 7/10. No medications tried for relief. Written by: Lenore Manner, SMA

## 2014-02-04 NOTE — Discharge Instructions (Signed)
The cause of your headache is likely from tight muscles in your neck Please do daily range of motion exercises  Please start the flexeril and meloxicam to help with the neck spasms and headache Please follow up with your regular doctor if you continue to have passing out episodes These are all likely due to whats called a vasovagal response.

## 2014-05-26 ENCOUNTER — Emergency Department (HOSPITAL_COMMUNITY)
Admission: EM | Admit: 2014-05-26 | Discharge: 2014-05-26 | Disposition: A | Discharge status: Home / Self Care | Payer: Self-pay | Source: Home / Self Care

## 2014-05-26 ENCOUNTER — Encounter (HOSPITAL_COMMUNITY): Payer: Self-pay | Admitting: Emergency Medicine

## 2014-05-26 DIAGNOSIS — K089 Disorder of teeth and supporting structures, unspecified: Secondary | ICD-10-CM

## 2014-05-26 DIAGNOSIS — W57XXXA Bitten or stung by nonvenomous insect and other nonvenomous arthropods, initial encounter: Secondary | ICD-10-CM

## 2014-05-26 DIAGNOSIS — S80862A Insect bite (nonvenomous), left lower leg, initial encounter: Secondary | ICD-10-CM

## 2014-05-26 DIAGNOSIS — K0889 Other specified disorders of teeth and supporting structures: Secondary | ICD-10-CM

## 2014-05-26 DIAGNOSIS — K047 Periapical abscess without sinus: Secondary | ICD-10-CM

## 2014-05-26 DIAGNOSIS — S90569A Insect bite (nonvenomous), unspecified ankle, initial encounter: Secondary | ICD-10-CM

## 2014-05-26 MED ORDER — HYDROCODONE-ACETAMINOPHEN 5-325 MG PO TABS: 1.0000 | ORAL_TABLET | ORAL | Status: DC | PRN

## 2014-05-26 MED ORDER — AMOXICILLIN 500 MG PO CAPS: 1000.0000 mg | ORAL_CAPSULE | Freq: Two times a day (BID) | ORAL | Status: DC

## 2014-05-26 NOTE — ED Provider Notes (Signed)
CSN: [id number]     Arrival date & time 05/26/14  1357 History   First MD Initiated Contact with Patient 05/26/14 1501     Chief Complaint  Patient presents with  . Dental Problem   (Consider location/radiation/quality/duration/timing/severity/associated sxs/prior Treatment) HPI Comments: Toothache L upper tooth    Past Medical History  Diagnosis Date  . Chlamydia   . Fibroids   . BV (bacterial vaginosis)   . Vaginal yeast infection    Past Surgical History  Procedure Laterality Date  . Tubal ligation     No family history on file. History  Substance Use Topics  . Smoking status: Current Every Day Smoker -- 0.50 packs/day  . Smokeless tobacco: Not on file  . Alcohol Use: No   OB History   Grav Para Term Preterm Abortions TAB SAB Ect Mult Living                 Review of Systems  All other systems reviewed and are negative.   Allergies  Review of patient's allergies indicates no known allergies.  Home Medications   Prior to Admission medications   Medication Sig Start Date End Date Taking? Authorizing Provider  amoxicillin (AMOXIL) 500 MG capsule Take 2 capsules (1,000 mg total) by mouth 2 (two) times daily. 05/26/14   Janne Napoleon, NP  cyclobenzaprine (FLEXERIL) 10 MG tablet Take 1 tablet (10 mg total) by mouth 3 (three) times daily as needed for muscle spasms. 02/04/14   Waldemar Dickens, MD  diclofenac (VOLTAREN) 75 MG EC tablet Take 1 tablet (75 mg total) by mouth 2 (two) times daily. 10/18/13   Gregor Hams, MD  HYDROcodone-acetaminophen (NORCO/VICODIN) 5-325 MG per tablet Take 1 tablet by mouth every 4 (four) hours as needed. 05/26/14   Janne Napoleon, NP  meloxicam (MOBIC) 15 MG tablet Take 1 tablet (15 mg total) by mouth daily. 02/04/14   Waldemar Dickens, MD  traMADol (ULTRAM) 50 MG tablet Take 1 tablet (50 mg total) by mouth every 6 (six) hours as needed for pain. 10/18/13   Gregor Hams, MD   BP 131/83  Pulse 90  Temp(Src) 98.2 F (36.8 C) (Oral)  Resp 18  SpO2  100% Physical Exam  Nursing note and vitals reviewed. Constitutional: She is oriented to person, place, and time. She appears well-developed and well-nourished. No distress.  HENT:  L upper 1st molar with gingival erythema and small abscess. Tooth with multiple fractures and pulp erosion. Mild facial swelling.  Neurological: She is alert and oriented to person, place, and time.  Skin: Skin is warm and dry.    ED Course  Procedures (including critical care time) Labs Review Labs Reviewed - No data to display  Imaging Review No results found.   MDM   1. Dental abscess   2. Pain, dental   3. Tick bite of left lower leg     norco 5 mg #15 Amoxicillin The L leg tick bite without redness, swelling. Healing well.     Janne Napoleon, NP 05/26/14 1511

## 2014-05-26 NOTE — ED Notes (Signed)
C/o bad tooth and tick bite

## 2014-05-26 NOTE — Discharge Instructions (Signed)
Abscessed Tooth An abscessed tooth is an infection around your tooth. It may be caused by holes or damage to the tooth (cavity) or a dental disease. An abscessed tooth causes mild to very bad pain in and around the tooth. See your dentist right away if you have tooth or gum pain. HOME CARE  Take your medicine as told. Finish it even if you start to feel better.  Do not drive after taking pain medicine.  Rinse your mouth (gargle) often with salt water ( teaspoon salt in 8 ounces of warm water).  Do not apply heat to the outside of your face. GET HELP RIGHT AWAY IF:   You have a temperature by mouth above 102 F (38.9 C), not controlled by medicine.  You have chills and a very bad headache.  You have problems breathing or swallowing.  Your mouth will not open.  You develop puffiness (swelling) on the neck or around the eye.  Your pain is not helped by medicine.  Your pain is getting worse instead of better. MAKE SURE YOU:   Understand these instructions.  Will watch your condition.  Will get help right away if you are not doing well or get worse. Document Released: 05/28/2008 Document Revised: 03/03/2012 Document Reviewed: 03/20/2011 [facility] Patient Information 2014 [place].  Dental Abscess A dental abscess is a collection of infected fluid (pus) from a bacterial infection in the inner part of the tooth (pulp). It usually occurs at the end of the tooth's root.  CAUSES   Severe tooth decay.  Trauma to the tooth that allows bacteria to enter into the pulp, such as a broken or chipped tooth. SYMPTOMS   Severe pain in and around the infected tooth.  Swelling and redness around the abscessed tooth or in the mouth or face.  Tenderness.  Pus drainage.  Bad breath.  Bitter taste in the mouth.  Difficulty swallowing.  Difficulty opening the mouth.  Nausea.  Vomiting.  Chills.  Swollen neck glands. DIAGNOSIS   A medical and dental history will be  taken.  An examination will be performed by tapping on the abscessed tooth.  X-rays may be taken of the tooth to identify the abscess. TREATMENT The goal of treatment is to eliminate the infection. You may be prescribed antibiotic medicine to stop the infection from spreading. A root canal may be performed to save the tooth. If the tooth cannot be saved, it may be pulled (extracted) and the abscess may be drained.  HOME CARE INSTRUCTIONS  Only take over-the-counter or prescription medicines for pain, fever, or discomfort as directed by your caregiver.  Rinse your mouth (gargle) often with salt water ( tsp salt in 8 oz [250 ml] of warm water) to relieve pain or swelling.  Do not drive after taking pain medicine (narcotics).  Do not apply heat to the outside of your face.  Return to your dentist for further treatment as directed. SEEK MEDICAL CARE IF:  Your pain is not helped by medicine.  Your pain is getting worse instead of better. SEEK IMMEDIATE MEDICAL CARE IF:  You have a fever or persistent symptoms for more than 2 3 days.  You have a fever and your symptoms suddenly get worse.  You have chills or a very bad headache.  You have problems breathing or swallowing.  You have trouble opening your mouth.  You have swelling in the neck or around the eye. Document Released: 12/10/2005 Document Revised: 09/03/2012 Document Reviewed: 03/20/2011 [employer] Patient Information  2014 [place], [place].  Dental Care and Dentist Visits Dental care supports good overall health. Regular dental visits can also help you avoid dental pain, bleeding, infection, and other more serious health problems in the future. It is important to keep the mouth healthy because diseases in the teeth, gums, and other oral tissues can spread to other areas of the body. Some problems, such as diabetes, heart disease, and pre-term labor have been associated with poor oral health.  See your dentist every 6 months.  If you experience emergency problems such as a toothache or broken tooth, go to the dentist right away. If you see your dentist regularly, you may catch problems early. It is easier to be treated for problems in the early stages.  WHAT TO EXPECT AT A DENTIST VISIT  Your dentist will look for many common oral health problems and recommend proper treatment. At your regular dental visit, you can expect:  Gentle cleaning of the teeth and gums. This includes scraping and polishing. This helps to remove the sticky substance around the teeth and gums (plaque). Plaque forms in the mouth shortly after eating. Over time, plaque hardens on the teeth as tartar. If tartar is not removed regularly, it can cause problems. Cleaning also helps remove stains.  Periodic X-rays. These pictures of the teeth and supporting bone will help your dentist assess the health of your teeth.  Periodic fluoride treatments. Fluoride is a natural mineral shown to help strengthen teeth. Fluoride treatmentinvolves applying a fluoride gel or varnish to the teeth. It is most commonly done in children.  Examination of the mouth, tongue, jaws, teeth, and gums to look for any oral health problems, such as:  Cavities (dental caries). This is decay on the tooth caused by plaque, sugar, and acid in the mouth. It is best to catch a cavity when it is small.  Inflammation of the gums caused by plaque buildup (gingivitis).  Problems with the mouth or malformed or misaligned teeth.  Oral cancer or other diseases of the soft tissues or jaws. KEEP YOUR TEETH AND GUMS HEALTHY For healthy teeth and gums, follow these general guidelines as well as your dentist's specific advice:  Have your teeth professionally cleaned at the dentist every 6 months.  Brush twice daily with a fluoride toothpaste.  Floss your teeth daily.  Ask your dentist if you need fluoride supplements, treatments, or fluoride toothpaste.  Eat a healthy diet. Reduce  foods and drinks with added sugar.  Avoid smoking. TREATMENT FOR ORAL HEALTH PROBLEMS If you have oral health problems, treatment varies depending on the conditions present in your teeth and gums.  Your caregiver will most likely recommend good oral hygiene at each visit.  For cavities, gingivitis, or other oral health disease, your caregiver will perform a procedure to treat the problem. This is typically done at a separate appointment. Sometimes your caregiver will refer you to another dental specialist for specific tooth problems or for surgery. SEEK IMMEDIATE DENTAL CARE IF:  You have pain, bleeding, or soreness in the gum, tooth, jaw, or mouth area.  A permanent tooth becomes loose or separated from the gum socket.  You experience a blow or injury to the mouth or jaw area. Document Released: 08/22/2011 Document Revised: 03/03/2012 Document Reviewed: 08/22/2011 [facility] Patient Information 2014 [place], [place].

## 2014-05-27 NOTE — ED Provider Notes (Signed)
Medical screening examination/treatment/procedure(s) were performed by a resident physician or non-physician practitioner and as the supervising physician I was immediately available for consultation/collaboration.  Lynne Leader, MD    Gregor Hams, MD 05/27/14 [phone]

## 2014-08-22 ENCOUNTER — Encounter (HOSPITAL_COMMUNITY): Payer: Self-pay | Admitting: Emergency Medicine

## 2014-08-22 ENCOUNTER — Emergency Department (HOSPITAL_COMMUNITY): Payer: Medicaid Other

## 2014-08-22 ENCOUNTER — Emergency Department (HOSPITAL_COMMUNITY)
Admission: EM | Admit: 2014-08-22 | Discharge: 2014-08-22 | Disposition: A | Discharge status: Home / Self Care | Payer: Medicaid Other | Attending: Emergency Medicine | Admitting: Emergency Medicine

## 2014-08-22 DIAGNOSIS — F17200 Nicotine dependence, unspecified, uncomplicated: Secondary | ICD-10-CM | POA: Insufficient documentation

## 2014-08-22 DIAGNOSIS — R5381 Other malaise: Secondary | ICD-10-CM | POA: Diagnosis not present

## 2014-08-22 DIAGNOSIS — Z8742 Personal history of other diseases of the female genital tract: Secondary | ICD-10-CM | POA: Insufficient documentation

## 2014-08-22 DIAGNOSIS — R109 Unspecified abdominal pain: Secondary | ICD-10-CM | POA: Diagnosis present

## 2014-08-22 DIAGNOSIS — Z8619 Personal history of other infectious and parasitic diseases: Secondary | ICD-10-CM | POA: Insufficient documentation

## 2014-08-22 DIAGNOSIS — Z3202 Encounter for pregnancy test, result negative: Secondary | ICD-10-CM | POA: Diagnosis not present

## 2014-08-22 DIAGNOSIS — R5383 Other fatigue: Secondary | ICD-10-CM | POA: Diagnosis not present

## 2014-08-22 LAB — URINALYSIS, ROUTINE W REFLEX MICROSCOPIC
Bilirubin Urine: NEGATIVE
GLUCOSE, UA: NEGATIVE mg/dL
HGB URINE DIPSTICK: NEGATIVE
Ketones, ur: NEGATIVE mg/dL
Leukocytes, UA: NEGATIVE
Nitrite: NEGATIVE
Protein, ur: 30 mg/dL — AB
SPECIFIC GRAVITY, URINE: 1.024 (ref 1.005–1.030)
UROBILINOGEN UA: 1 mg/dL (ref 0.0–1.0)
pH: 8.5 — ABNORMAL HIGH (ref 5.0–8.0)

## 2014-08-22 LAB — POC URINE PREG, ED: PREG TEST UR: NEGATIVE

## 2014-08-22 LAB — CBC WITH DIFFERENTIAL/PLATELET
BASOS ABS: 0 10*3/uL (ref 0.0–0.1)
Basophils Relative: 0 % (ref 0–1)
EOS ABS: 0.3 10*3/uL (ref 0.0–0.7)
EOS PCT: 2 % (ref 0–5)
HCT: 40.3 % (ref 36.0–46.0)
Hemoglobin: 14.7 g/dL (ref 12.0–15.0)
LYMPHS PCT: 35 % (ref 12–46)
Lymphs Abs: 4.1 10*3/uL — ABNORMAL HIGH (ref 0.7–4.0)
MCH: 30.7 pg (ref 26.0–34.0)
MCHC: 36.5 g/dL — ABNORMAL HIGH (ref 30.0–36.0)
MCV: 84.1 fL (ref 78.0–100.0)
Monocytes Absolute: 0.4 10*3/uL (ref 0.1–1.0)
Monocytes Relative: 3 % (ref 3–12)
Neutro Abs: 7 10*3/uL (ref 1.7–7.7)
Neutrophils Relative %: 60 % (ref 43–77)
PLATELETS: 196 10*3/uL (ref 150–400)
RBC: 4.79 MIL/uL (ref 3.87–5.11)
RDW: 13.7 % (ref 11.5–15.5)
WBC: 11.8 10*3/uL — AB (ref 4.0–10.5)

## 2014-08-22 LAB — COMPREHENSIVE METABOLIC PANEL
ALBUMIN: 4.1 g/dL (ref 3.5–5.2)
ALT: 16 U/L (ref 0–35)
AST: 27 U/L (ref 0–37)
Alkaline Phosphatase: 60 U/L (ref 39–117)
Anion gap: 14 (ref 5–15)
BUN: 11 mg/dL (ref 6–23)
CALCIUM: 8.8 mg/dL (ref 8.4–10.5)
CO2: 20 mEq/L (ref 19–32)
Chloride: 105 mEq/L (ref 96–112)
Creatinine, Ser: 0.74 mg/dL (ref 0.50–1.10)
GFR calc Af Amer: >90 mL/min (ref >90)
GFR calc non Af Amer: >90 mL/min (ref >90)
Glucose, Bld: 96 mg/dL (ref 70–99)
Potassium: 3.9 mEq/L (ref 3.7–5.3)
SODIUM: 139 meq/L (ref 137–147)
TOTAL PROTEIN: 7.8 g/dL (ref 6.0–8.3)
Total Bilirubin: 0.8 mg/dL (ref 0.3–1.2)

## 2014-08-22 LAB — WET PREP, GENITAL
TRICH WET PREP: NONE SEEN
YEAST WET PREP: NONE SEEN

## 2014-08-22 LAB — URINE MICROSCOPIC-ADD ON

## 2014-08-22 LAB — I-STAT TROPONIN, ED: TROPONIN I, POC: 0 ng/mL (ref 0.00–0.08)

## 2014-08-22 MED ORDER — SODIUM CHLORIDE 0.9 % IV BOLUS (SEPSIS)
1000.0000 mL | Freq: Once | INTRAVENOUS | Status: AC
  Administered 2014-08-22: 1000 mL via INTRAVENOUS

## 2014-08-22 MED ORDER — IBUPROFEN 800 MG PO TABS: 800.0000 mg | ORAL_TABLET | Freq: Three times a day (TID) | ORAL | Status: DC

## 2014-08-22 MED ORDER — OXYCODONE-ACETAMINOPHEN 5-325 MG PO TABS
1.0000 | ORAL_TABLET | Freq: Once | ORAL | Status: AC
  Administered 2014-08-22: 1 via ORAL
  Filled 2014-08-22: qty 1

## 2014-08-22 MED ORDER — HYDROCODONE-ACETAMINOPHEN 5-325 MG PO TABS: 1.0000 | ORAL_TABLET | Freq: Four times a day (QID) | ORAL | Status: DC | PRN

## 2014-08-22 NOTE — ED Provider Notes (Signed)
CSN: [id number]     Arrival date & time 08/22/14  1733 History   First MD Initiated Contact with Patient 08/22/14 1827     Chief Complaint  Patient presents with  . Abdominal Cramping  . Weakness     (Consider location/radiation/quality/duration/timing/severity/associated sxs/prior Treatment) HPI Comments: Patient presents to the emergency department with chief complaint of lower abdominal pain, fatigue, and generalized weakness. She states that she had heavy menstrual cramps that started today. She states that she just started menstruating while using the bathroom in the waiting room. She denies any associated dysuria. Denies fevers, or chills.  Additionally, she states that earlier today she feel a little lightheaded, became sweaty, and a little shortness of breath. These are new symptoms for her. The symptoms have since resolved. She did not take anything for her symptoms. Nothing makes the symptoms better or worse.  The history is provided by the patient. No language interpreter was used.    Past Medical History  Diagnosis Date  . Chlamydia   . Fibroids   . BV (bacterial vaginosis)   . Vaginal yeast infection    Past Surgical History  Procedure Laterality Date  . Tubal ligation     History reviewed. No pertinent family history. History  Substance Use Topics  . Smoking status: Current Every Day Smoker -- 0.50 packs/day  . Smokeless tobacco: Not on file  . Alcohol Use: No   OB History   Grav Para Term Preterm Abortions TAB SAB Ect Mult Living                 Review of Systems  All other systems reviewed and are negative.     Allergies  Review of patient's allergies indicates no known allergies.  Home Medications   Prior to Admission medications   Medication Sig Start Date End Date Taking? Authorizing Provider  acetaminophen (TYLENOL) 500 MG tablet Take 1,000 mg by mouth every 6 (six) hours as needed for mild pain.   Yes Historical Provider, MD   BP 141/82   Pulse 57  Temp(Src) 97.8 F (36.6 C) (Oral)  Resp 18  SpO2 100%  LMP 08/22/2014 Physical Exam  Nursing note and vitals reviewed. Constitutional: She is oriented to person, place, and time. She appears well-developed and well-nourished.  HENT:  Head: Normocephalic and atraumatic.  Eyes: Conjunctivae and EOM are normal. Pupils are equal, round, and reactive to light.  Neck: Normal range of motion. Neck supple.  Cardiovascular: Normal rate and regular rhythm.  Exam reveals no gallop and no friction rub.   No murmur heard. Pulmonary/Chest: Effort normal and breath sounds normal. No respiratory distress. She has no wheezes. She has no rales. She exhibits no tenderness.  Abdominal: Soft. Bowel sounds are normal. She exhibits no distension and no mass. There is tenderness. There is no rebound and no guarding.  Moderate lower abdominal tenderness  Genitourinary:  Pelvic exam chaperoned by female ER tech, no right adnexa tenderness, moderate left adnexal tenderness, no uterine tenderness, no vaginal discharge, moderate bleeding, no CMT or friability, no foreign body, no injury to the external genitalia, no other significant findings   Musculoskeletal: Normal range of motion. She exhibits no edema and no tenderness.  Neurological: She is alert and oriented to person, place, and time.  Skin: Skin is warm and dry.  Psychiatric: She has a normal mood and affect. Her behavior is normal. Judgment and thought content normal.    ED Course  Procedures (including critical care time) Results for  orders placed during the hospital encounter of 08/22/14  WET PREP, GENITAL      Result Value Ref Range   Yeast Wet Prep HPF POC NONE SEEN  NONE SEEN   Trich, Wet Prep NONE SEEN  NONE SEEN   Clue Cells Wet Prep HPF POC FEW (*) NONE SEEN   WBC, Wet Prep HPF POC FEW (*) NONE SEEN  CBC WITH DIFFERENTIAL      Result Value Ref Range   WBC 11.8 (*) 4.0 - 10.5 K/uL   RBC 4.79  3.87 - 5.11 MIL/uL   Hemoglobin 14.7   12.0 - 15.0 g/dL   HCT 40.3  36.0 - 46.0 %   MCV 84.1  78.0 - 100.0 fL   MCH 30.7  26.0 - 34.0 pg   MCHC 36.5 (*) 30.0 - 36.0 g/dL   RDW 13.7  11.5 - 15.5 %   Platelets 196  150 - 400 K/uL   Neutrophils Relative % 60  43 - 77 %   Neutro Abs 7.0  1.7 - 7.7 K/uL   Lymphocytes Relative 35  12 - 46 %   Lymphs Abs 4.1 (*) 0.7 - 4.0 K/uL   Monocytes Relative 3  3 - 12 %   Monocytes Absolute 0.4  0.1 - 1.0 K/uL   Eosinophils Relative 2  0 - 5 %   Eosinophils Absolute 0.3  0.0 - 0.7 K/uL   Basophils Relative 0  0 - 1 %   Basophils Absolute 0.0  0.0 - 0.1 K/uL  COMPREHENSIVE METABOLIC PANEL      Result Value Ref Range   Sodium 139  137 - 147 mEq/L   Potassium 3.9  3.7 - 5.3 mEq/L   Chloride 105  96 - 112 mEq/L   CO2 20  19 - 32 mEq/L   Glucose, Bld 96  70 - 99 mg/dL   BUN 11  6 - 23 mg/dL   Creatinine, Ser 0.74  0.50 - 1.10 mg/dL   Calcium 8.8  8.4 - 10.5 mg/dL   Total Protein 7.8  6.0 - 8.3 g/dL   Albumin 4.1  3.5 - 5.2 g/dL   AST 27  0 - 37 U/L   ALT 16  0 - 35 U/L   Alkaline Phosphatase 60  39 - 117 U/L   Total Bilirubin 0.8  0.3 - 1.2 mg/dL   GFR calc non Af Amer >90  >90 mL/min   GFR calc Af Amer >90  >90 mL/min   Anion gap 14  5 - 15  URINALYSIS, ROUTINE W REFLEX MICROSCOPIC      Result Value Ref Range   Color, Urine YELLOW  YELLOW   APPearance TURBID (*) CLEAR   Specific Gravity, Urine 1.024  1.005 - 1.030   pH 8.5 (*) 5.0 - 8.0   Glucose, UA NEGATIVE  NEGATIVE mg/dL   Hgb urine dipstick NEGATIVE  NEGATIVE   Bilirubin Urine NEGATIVE  NEGATIVE   Ketones, ur NEGATIVE  NEGATIVE mg/dL   Protein, ur 30 (*) NEGATIVE mg/dL   Urobilinogen, UA 1.0  0.0 - 1.0 mg/dL   Nitrite NEGATIVE  NEGATIVE   Leukocytes, UA NEGATIVE  NEGATIVE  URINE MICROSCOPIC-ADD ON      Result Value Ref Range   Squamous Epithelial / LPF RARE  RARE   WBC, UA 0-2  <3 WBC/hpf   RBC / HPF 0-2  <3 RBC/hpf   Bacteria, UA FEW (*) RARE   Urine-Other AMORPHOUS URATES/PHOSPHATES    POC URINE  PREG, ED       Result Value Ref Range   Preg Test, Ur NEGATIVE  NEGATIVE  I-STAT TROPOININ, ED      Result Value Ref Range   Troponin i, poc 0.00  0.00 - 0.08 ng/mL   Comment 3            Dg Chest 2 View  08/22/2014   CLINICAL DATA:  Shortness of breath, dizziness, lightheaded.  EXAM: CHEST  2 VIEW  COMPARISON:  None.  FINDINGS: The heart size and mediastinal contours are within normal limits. Both lungs are clear. The visualized skeletal structures are unremarkable.  IMPRESSION: No active cardiopulmonary disease.   Electronically Signed   By: Rolm Baptise M.D.   On: 08/22/2014 19:50   US Transvaginal Non-ob  08/22/2014   CLINICAL DATA:  Pelvic pain  EXAM: TRANSABDOMINAL ULTRASOUND OF PELVIS  TECHNIQUE: Transabdominal ultrasound examination of the pelvis was performed including evaluation of the uterus, ovaries, adnexal regions, and pelvic cul-de-sac.  COMPARISON:  Prior ultrasound from 07/19/2010  FINDINGS: Uterus  Measurements: 10.3 x 6.7 x 5.9 cm. 2.3 x 1.8 x 2.1 cm. Hypoechoic fibroid present within the posterior uterine body.  Endometrium  Thickness: 11.3 mm.  No focal abnormality visualized.  Right ovary  Measurements: 4.5 x 1.8 x 2.6 cm. Normal appearance/no adnexal mass. Arterial and venous waveforms are identified without sonographic evidence of ovarian torsion.  Left ovary  Measurements: 5.3 x 4.6 x 5.3 cm. Normal appearance. Arterial and venous waveforms are identified without sonographic evidence of ovarian torsion. Again seen is a complex partially cystic, partially solid lesion within the left ovary measuring 4.6 x 3.9 x 4.6 cm. Internal hyperechoic material with scant vascularity seen within this lesion. Echogenic focus with posterior shadowing within this lesion is seen, suggesting calcification. Finding may reflect a dermoid.  Other findings:  Small volume free fluid within the pelvis.  IMPRESSION: 1. No sonographic evidence of ovarian torsion. 2. Complex left ovarian lesion, most suggestive of a  dermoid. 3. Fibroid uterus. 4. Small volume free fluid within the pelvis.   Electronically Signed   By: Jeannine Boga M.D.   On: 08/22/2014 22:57   US Pelvis Complete  08/22/2014   CLINICAL DATA:  Pelvic pain  EXAM: TRANSABDOMINAL ULTRASOUND OF PELVIS  TECHNIQUE: Transabdominal ultrasound examination of the pelvis was performed including evaluation of the uterus, ovaries, adnexal regions, and pelvic cul-de-sac.  COMPARISON:  Prior ultrasound from 07/19/2010  FINDINGS: Uterus  Measurements: 10.3 x 6.7 x 5.9 cm. 2.3 x 1.8 x 2.1 cm. Hypoechoic fibroid present within the posterior uterine body.  Endometrium  Thickness: 11.3 mm.  No focal abnormality visualized.  Right ovary  Measurements: 4.5 x 1.8 x 2.6 cm. Normal appearance/no adnexal mass. Arterial and venous waveforms are identified without sonographic evidence of ovarian torsion.  Left ovary  Measurements: 5.3 x 4.6 x 5.3 cm. Normal appearance. Arterial and venous waveforms are identified without sonographic evidence of ovarian torsion. Again seen is a complex partially cystic, partially solid lesion within the left ovary measuring 4.6 x 3.9 x 4.6 cm. Internal hyperechoic material with scant vascularity seen within this lesion. Echogenic focus with posterior shadowing within this lesion is seen, suggesting calcification. Finding may reflect a dermoid.  Other findings:  Small volume free fluid within the pelvis.  IMPRESSION: 1. No sonographic evidence of ovarian torsion. 2. Complex left ovarian lesion, most suggestive of a dermoid. 3. Fibroid uterus. 4. Small volume free fluid within the pelvis.   Electronically Signed  By: Jeannine Boga M.D.   On: 08/22/2014 22:57   [place] Art/ven Flow Abd Pelv Doppler  08/22/2014   CLINICAL DATA:  Pelvic pain  EXAM: TRANSABDOMINAL ULTRASOUND OF PELVIS  TECHNIQUE: Transabdominal ultrasound examination of the pelvis was performed including evaluation of the uterus, ovaries, adnexal regions, and pelvic cul-de-sac.   COMPARISON:  Prior ultrasound from 07/19/2010  FINDINGS: Uterus  Measurements: 10.3 x 6.7 x 5.9 cm. 2.3 x 1.8 x 2.1 cm. Hypoechoic fibroid present within the posterior uterine body.  Endometrium  Thickness: 11.3 mm.  No focal abnormality visualized.  Right ovary  Measurements: 4.5 x 1.8 x 2.6 cm. Normal appearance/no adnexal mass. Arterial and venous waveforms are identified without sonographic evidence of ovarian torsion.  Left ovary  Measurements: 5.3 x 4.6 x 5.3 cm. Normal appearance. Arterial and venous waveforms are identified without sonographic evidence of ovarian torsion. Again seen is a complex partially cystic, partially solid lesion within the left ovary measuring 4.6 x 3.9 x 4.6 cm. Internal hyperechoic material with scant vascularity seen within this lesion. Echogenic focus with posterior shadowing within this lesion is seen, suggesting calcification. Finding may reflect a dermoid.  Other findings:  Small volume free fluid within the pelvis.  IMPRESSION: 1. No sonographic evidence of ovarian torsion. 2. Complex left ovarian lesion, most suggestive of a dermoid. 3. Fibroid uterus. 4. Small volume free fluid within the pelvis.   Electronically Signed   By: Jeannine Boga M.D.   On: 08/22/2014 22:57      EKG Interpretation   Date/Time:  Sunday August 22 2014 19:27:40 EDT Ventricular Rate:  62 PR Interval:  131 QRS Duration: 99 QT Interval:  415 QTC Calculation: 421 R Axis:   71 Text Interpretation:  Sinus rhythm Normal ECG Confirmed by POLLINA  MD,  CHRISTOPHER [id number]) on 08/22/2014 7:31:52 PM      MDM   Final diagnoses:  Abdominal pain, unspecified abdominal location    Patient with lower abdominal pain.  States that she just started her period and is having cramps.  Will check labs and pelvic.  Pelvic remarkable for menstrual bleeding, no hemorrhage.  Mild left adnexal tenderness.  Will check pelvic US.    [place] remarkable for cyst, possibly dermoid, will recommend OBGYN  follow-up.  Regarding the SOB, patient feels well.  Negative trop, normal EKG, normal CXR. Never had any chest pain.  HEART score is 0.  PERC is negative.  Patient discussed with Dr. Betsey Holiday, recommends OBGYN follow-up.   Montine Circle, PA-C 08/22/14 [phone]

## 2014-08-22 NOTE — ED Provider Notes (Signed)
Medical screening examination/treatment/procedure(s) were performed by non-physician practitioner and as supervising physician I was immediately available for consultation/collaboration.   EKG Interpretation   Date/Time:  Sunday August 22 2014 19:27:40 EDT Ventricular Rate:  62 PR Interval:  131 QRS Duration: 99 QT Interval:  415 QTC Calculation: 421 R Axis:   71 Text Interpretation:  Sinus rhythm Normal ECG Confirmed by Betsey Holiday  MD,  Levaughn Puccinelli ([id number]) on 08/22/2014 7:31:52 PM        Orpah Greek, MD 08/22/14 2328

## 2014-08-22 NOTE — ED Notes (Signed)
Reports onset today menstrual cramping and now pt having fatigue and generalized weakness. Denies heavy bleeding.

## 2014-08-22 NOTE — Discharge Instructions (Signed)
Abdominal Pain  Many things can cause abdominal pain. Usually, abdominal pain is not caused by a disease and will improve without treatment. It can often be observed and treated at home. Your health care provider will do a physical exam and possibly order blood tests and X-rays to help determine the seriousness of your pain. However, in many cases, more time must pass before a clear cause of the pain can be found. Before that point, your health care provider may not know if you need more testing or further treatment.  HOME CARE INSTRUCTIONS   Monitor your abdominal pain for any changes. The following actions may help to alleviate any discomfort you are experiencing:  · Only take over-the-counter or prescription medicines as directed by your health care provider.  · Do not take laxatives unless directed to do so by your health care provider.  · Try a clear liquid diet (broth, tea, or water) as directed by your health care provider. Slowly move to a bland diet as tolerated.  SEEK MEDICAL CARE IF:  · You have unexplained abdominal pain.  · You have abdominal pain associated with nausea or diarrhea.  · You have pain when you urinate or have a bowel movement.  · You experience abdominal pain that wakes you in the night.  · You have abdominal pain that is worsened or improved by eating food.  · You have abdominal pain that is worsened with eating fatty foods.  · You have a fever.  SEEK IMMEDIATE MEDICAL CARE IF:   · Your pain does not go away within 2 hours.  · You keep throwing up (vomiting).  · Your pain is felt only in portions of the abdomen, such as the right side or the left lower portion of the abdomen.  · You pass bloody or black tarry stools.  MAKE SURE YOU:  · Understand these instructions.    · Will watch your condition.    · Will get help right away if you are not doing well or get worse.    Document Released: 09/19/2005 Document Revised: 12/15/2013 Document Reviewed: 08/19/2013  ExitCare® Patient Information  ©2015 [employer]. This information is not intended to replace advice given to you by your health care provider. Make sure you discuss any questions you have with your health care provider.        Ovarian Cyst  An ovarian cyst is a fluid-filled sac that forms on an ovary. The ovaries are small organs that produce eggs in women. Various types of cysts can form on the ovaries. Most are not cancerous. Many do not cause problems, and they often go away on their own. Some may cause symptoms and require treatment. Common types of ovarian cysts include:  · Functional cysts--These cysts may occur every month during the menstrual cycle. This is normal. The cysts usually go away with the next menstrual cycle if the woman does not get pregnant. Usually, there are no symptoms with a functional cyst.  · Endometrioma cysts--These cysts form from the tissue that lines the uterus. They are also called "chocolate cysts" because they become filled with blood that turns brown. This type of cyst can cause pain in the lower abdomen during intercourse and with your menstrual period.  · Cystadenoma cysts--This type develops from the cells on the outside of the ovary. These cysts can get very big and cause lower abdomen pain and pain with intercourse. This type of cyst can twist on itself, cut off its blood supply, and cause severe   pain. It can also easily rupture and cause a lot of pain.  · Dermoid cysts--This type of cyst is sometimes found in both ovaries. These cysts may contain different kinds of body tissue, such as skin, teeth, hair, or cartilage. They usually do not cause symptoms unless they get very big.  · Theca lutein cysts--These cysts occur when too much of a certain hormone (human chorionic gonadotropin) is produced and overstimulates the ovaries to produce an egg. This is most common after procedures used to assist with the conception of a baby (in vitro fertilization).  CAUSES   · Fertility drugs can cause a condition in  which multiple large cysts are formed on the ovaries. This is called ovarian hyperstimulation syndrome.  · A condition called polycystic ovary syndrome can cause hormonal imbalances that can lead to nonfunctional ovarian cysts.  SIGNS AND SYMPTOMS   Many ovarian cysts do not cause symptoms. If symptoms are present, they may include:  · Pelvic pain or pressure.  · Pain in the lower abdomen.  · Pain during sexual intercourse.  · Increasing girth (swelling) of the abdomen.  · Abnormal menstrual periods.  · Increasing pain with menstrual periods.  · Stopping having menstrual periods without being pregnant.  DIAGNOSIS   These cysts are commonly found during a routine or annual pelvic exam. Tests may be ordered to find out more about the cyst. These tests may include:  · Ultrasound.  · X-ray of the pelvis.  · CT scan.  · MRI.  · Blood tests.  TREATMENT   Many ovarian cysts go away on their own without treatment. Your health care provider may want to check your cyst regularly for 2-3 months to see if it changes. For women in menopause, it is particularly important to monitor a cyst closely because of the higher rate of ovarian cancer in menopausal women. When treatment is needed, it may include any of the following:  · A procedure to drain the cyst (aspiration). This may be done using a long needle and ultrasound. It can also be done through a laparoscopic procedure. This involves using a thin, lighted tube with a tiny camera on the end (laparoscope) inserted through a small incision.  · Surgery to remove the whole cyst. This may be done using laparoscopic surgery or an open surgery involving a larger incision in the lower abdomen.  · Hormone treatment or birth control pills. These methods are sometimes used to help dissolve a cyst.  HOME CARE INSTRUCTIONS   · Only take over-the-counter or prescription medicines as directed by your health care provider.  · Follow up with your health care provider as directed.  · Get  regular pelvic exams and Pap tests.  SEEK MEDICAL CARE IF:   · Your periods are late, irregular, or painful, or they stop.  · Your pelvic pain or abdominal pain does not go away.  · Your abdomen becomes larger or swollen.  · You have pressure on your bladder or trouble emptying your bladder completely.  · You have pain during sexual intercourse.  · You have feelings of fullness, pressure, or discomfort in your stomach.  · You lose weight for no apparent reason.  · You feel generally ill.  · You become constipated.  · You lose your appetite.  · You develop acne.  · You have an increase in body and facial hair.  · You are gaining weight, without changing your exercise and eating habits.  · You think you are pregnant.    SEEK IMMEDIATE MEDICAL CARE IF:   · You have increasing abdominal pain.  · You feel sick to your stomach (nauseous), and you throw up (vomit).  · You develop a fever that comes on suddenly.  · You have abdominal pain during a bowel movement.  · Your menstrual periods become heavier than usual.  MAKE SURE YOU:  · Understand these instructions.  · Will watch your condition.  · Will get help right away if you are not doing well or get worse.  Document Released: 12/10/2005 Document Revised: 12/15/2013 Document Reviewed: 08/17/2013  ExitCare® Patient Information ©2015 [employer]. This information is not intended to replace advice given to you by your health care provider. Make sure you discuss any questions you have with your health care provider.

## 2014-08-22 NOTE — ED Notes (Signed)
Pt brought back to room via wheelchair with family in tow; pt given a gown to place on; pt taken via wheelchair to bathroom; pt attempting to give an urine sample at this time; daughter waiting outside of bathroom and will push her mother back to room; Anderson Malta, NS aware and will let Ronny Bacon, NT know

## 2014-08-24 LAB — GC/CHLAMYDIA PROBE AMP
CT Probe RNA: NEGATIVE
GC Probe RNA: NEGATIVE

## 2014-09-02 ENCOUNTER — Telehealth: Payer: Self-pay

## 2014-09-02 NOTE — Telephone Encounter (Signed)
COULD NOT GET THROUGH TO PATIENT WITH THIS PHONE NUMBER - CALLED DR MARSHALL'S OFFICE AND LET THEM KNOW. THEY SAID THEY WOULD FIND ANOTHER NUMBER.

## 2014-09-27 ENCOUNTER — Ambulatory Visit (INDEPENDENT_AMBULATORY_CARE_PROVIDER_SITE_OTHER): Payer: Medicaid Other | Admitting: Obstetrics & Gynecology

## 2014-09-27 ENCOUNTER — Encounter: Payer: Self-pay | Admitting: Obstetrics & Gynecology

## 2014-09-27 VITALS — BP 142/92 | HR 69 | Temp 97.0°F | Ht 62.0 in | Wt 137.0 lb

## 2014-09-27 DIAGNOSIS — N9489 Other specified conditions associated with female genital organs and menstrual cycle: Secondary | ICD-10-CM | POA: Insufficient documentation

## 2014-09-27 DIAGNOSIS — D251 Intramural leiomyoma of uterus: Secondary | ICD-10-CM | POA: Insufficient documentation

## 2014-09-27 DIAGNOSIS — R102 Pelvic and perineal pain: Secondary | ICD-10-CM

## 2014-09-27 DIAGNOSIS — N949 Unspecified condition associated with female genital organs and menstrual cycle: Secondary | ICD-10-CM

## 2014-09-27 DIAGNOSIS — N939 Abnormal uterine and vaginal bleeding, unspecified: Secondary | ICD-10-CM | POA: Insufficient documentation

## 2014-09-27 NOTE — Patient Instructions (Addendum)
Endometriosis Endometriosis is a condition in which the tissue that lines the uterus (endometrium) grows outside of its normal location. The tissue may grow in many locations close to the uterus, but it commonly grows on the ovaries, fallopian tubes, vagina, or bowel. Because the uterus expels, or sheds, its lining every menstrual cycle, there is bleeding wherever the endometrial tissue is located. This can cause pain because blood is irritating to tissues not normally exposed to it.  CAUSES  The cause of endometriosis is not known.  SIGNS AND SYMPTOMS  Often, there are no symptoms. When symptoms are present, they can vary with the location of the displaced tissue. Various symptoms can occur at different times. Although symptoms occur mainly during a woman's menstrual period, they can also occur midcycle and usually stop with menopause. Some people may go months with no symptoms at all. Symptoms may include:   Back or abdominal pain.   Heavier bleeding during periods.   Pain during intercourse.   Painful bowel movements.   Infertility. DIAGNOSIS  Your health care provider will do a physical exam and ask about your symptoms. Various tests may be done, such as:   Blood tests and urine tests. These are done to help rule out other problems.   Ultrasound. This test is done to look for abnormal tissue.   An X-ray of the lower bowel (barium enema).  Laparoscopy. In this procedure, a thin, lighted tube with a tiny camera on the end (laparoscope) is inserted into your abdomen. This helps your health care provider look for abnormal tissue to confirm the diagnosis. The health care provider may also remove a small piece of tissue (biopsy) from any abnormal tissue found. This tissue sample can then be sent to a lab so it can be looked at under a microscope. TREATMENT  Treatment will vary and may include:   Medicines to relieve pain. Nonsteroidal anti-inflammatory drugs (NSAIDs) are a type of  pain medicine that can help to relieve the pain caused by endometriosis.  Hormonal therapy. When using hormonal therapy, periods are eliminated. This eliminates the monthly exposure to blood by the displaced endometrial tissue.   Surgery. Surgery may sometimes be done to remove the abnormal endometrial tissue. In severe cases, surgery may be done to remove the fallopian tubes, uterus, and ovaries (hysterectomy). HOME CARE INSTRUCTIONS   Take all medicines as directed by your health care provider. Do not take aspirin because it may increase bleeding when you are not on hormonal therapy.   Avoid activities that produce pain, including sexual activity. SEEK MEDICAL CARE IF:  You have pelvic pain before, after, or during your periods.  You have pelvic pain between periods that gets worse during your period.  You have pelvic pain during or after sex.  You have pelvic pain with bowel movements or urination, especially during your period.  You have problems getting pregnant.  You have a fever. SEEK IMMEDIATE MEDICAL CARE IF:   Your pain is severe and is not responding to pain medicine.   You have severe nausea and vomiting, or you cannot keep foods down.   You have pain that is limited to the right lower part of your abdomen.   You have swelling or increasing pain in your abdomen.   You see blood in your stool.  MAKE SURE YOU:   Understand these instructions.  Will watch your condition.  Will get help right away if you are not doing well or get worse. Document Released: 12/07/2000 Document  Revised: 04/26/2014 Document Reviewed: 08/07/2013 [facility] Patient Information 2015 [place], [place]. This information is not intended to replace advice given to you by your health care provider. Make sure you discuss any questions you have with your health care provider. Hysterectomy Information  A hysterectomy is a surgery in which your uterus is removed. This surgery may be done to  treat various medical problems. After the surgery, you will no longer have menstrual periods. The surgery will also make you unable to become pregnant (sterile). The fallopian tubes and ovaries can be removed (bilateral salpingo-oophorectomy) during this surgery as well.  REASONS FOR A HYSTERECTOMY  Persistent, abnormal bleeding.  Lasting (chronic) pelvic pain or infection.  The lining of the uterus (endometrium) starts growing outside the uterus (endometriosis).  The endometrium starts growing in the muscle of the uterus (adenomyosis).  The uterus falls down into the vagina (pelvic organ prolapse).  Noncancerous growths in the uterus (uterine fibroids) that cause symptoms.  Precancerous cells.  Cervical cancer or uterine cancer. TYPES OF HYSTERECTOMIES  Supracervical hysterectomy--In this type, the top part of the uterus is removed, but not the cervix.  Total hysterectomy--The uterus and cervix are removed.  Radical hysterectomy--The uterus, the cervix, and the fibrous tissue that holds the uterus in place in the pelvis (parametrium) are removed. WAYS A HYSTERECTOMY CAN BE PERFORMED  Abdominal hysterectomy--A large surgical cut (incision) is made in the abdomen. The uterus is removed through this incision.  Vaginal hysterectomy--An incision is made in the vagina. The uterus is removed through this incision. There are no abdominal incisions.  Conventional laparoscopic hysterectomy--Three or four small incisions are made in the abdomen. A thin, lighted tube with a camera (laparoscope) is inserted into one of the incisions. Other tools are put through the other incisions. The uterus is cut into small pieces. The small pieces are removed through the incisions, or they are removed through the vagina.  Laparoscopically assisted vaginal hysterectomy (LAVH)--Three or four small incisions are made in the abdomen. Part of the surgery is performed laparoscopically and part vaginally. The  uterus is removed through the vagina.  Robot-assisted laparoscopic hysterectomy--A laparoscope and other tools are inserted into 3 or 4 small incisions in the abdomen. A computer-controlled device is used to give the surgeon a 3D image and to help control the surgical instruments. This allows for more precise movements of surgical instruments. The uterus is cut into small pieces and removed through the incisions or removed through the vagina. RISKS AND COMPLICATIONS  Possible complications associated with this procedure include:  Bleeding and risk of blood transfusion. Tell your health care provider if you do not want to receive any blood products.  Blood clots in the legs or lung.  Infection.  Injury to surrounding organs.  Problems or side effects related to anesthesia.  Conversion to an abdominal hysterectomy from one of the other techniques. WHAT TO EXPECT AFTER A HYSTERECTOMY  You will be given pain medicine.  You will need to have someone with you for the first 3-5 days after you go home.  You will need to follow up with your surgeon in 2-4 weeks after surgery to evaluate your progress.  You may have early menopause symptoms such as hot flashes, night sweats, and insomnia.  If you had a hysterectomy for a problem that was not cancer or not a condition that could lead to cancer, then you no longer need Pap tests. However, even if you no longer need a Pap test, a regular exam is  a good idea to make sure no other problems are starting. Document Released: 06/05/2001 Document Revised: 09/30/2013 Document Reviewed: 08/17/2013 [facility] Patient Information 2015 [place], [place]. This information is not intended to replace advice given to you by your health care provider. Make sure you discuss any questions you have with your health care provider. Smoking Cessation Quitting smoking is important to your health and has many advantages. However, it is not always easy to quit since nicotine is  a very addictive drug. Oftentimes, people try 3 times or more before being able to quit. This document explains the best ways for you to prepare to quit smoking. Quitting takes hard work and a lot of effort, but you can do it. ADVANTAGES OF QUITTING SMOKING  You will live longer, feel better, and live better.  Your body will feel the impact of quitting smoking almost immediately.  Within 20 minutes, blood pressure decreases. Your pulse returns to its normal level.  After 8 hours, carbon monoxide levels in the blood return to normal. Your oxygen level increases.  After 24 hours, the chance of having a heart attack starts to decrease. Your breath, hair, and body stop smelling like smoke.  After 48 hours, damaged nerve endings begin to recover. Your sense of taste and smell improve.  After 72 hours, the body is virtually free of nicotine. Your bronchial tubes relax and breathing becomes easier.  After 2 to 12 weeks, lungs can hold more air. Exercise becomes easier and circulation improves.  The risk of having a heart attack, stroke, cancer, or lung disease is greatly reduced.  After 1 year, the risk of coronary heart disease is cut in half.  After 5 years, the risk of stroke falls to the same as a nonsmoker.  After 10 years, the risk of lung cancer is cut in half and the risk of other cancers decreases significantly.  After 15 years, the risk of coronary heart disease drops, usually to the level of a nonsmoker.  If you are pregnant, quitting smoking will improve your chances of having a healthy baby.  The people you live with, especially any children, will be healthier.  You will have extra money to spend on things other than cigarettes. QUESTIONS TO THINK ABOUT BEFORE ATTEMPTING TO QUIT You may want to talk about your answers with your health care provider.  Why do you want to quit?  If you tried to quit in the past, what helped and what did not?  What will be the most  difficult situations for you after you quit? How will you plan to handle them?  Who can help you through the tough times? Your family? Friends? A health care provider?  What pleasures do you get from smoking? What ways can you still get pleasure if you quit? Here are some questions to ask your health care provider:  How can you help me to be successful at quitting?  What medicine do you think would be best for me and how should I take it?  What should I do if I need more help?  What is smoking withdrawal like? How can I get information on withdrawal? GET READY  Set a quit date.  Change your environment by getting rid of all cigarettes, ashtrays, matches, and lighters in your home, car, or work. Do not let people smoke in your home.  Review your past attempts to quit. Think about what worked and what did not. GET SUPPORT AND ENCOURAGEMENT You have a better chance of  being successful if you have help. You can get support in many ways.  Tell your family, friends, and coworkers that you are going to quit and need their support. Ask them not to smoke around you.  Get individual, group, or telephone counseling and support. Programs are available at [employer] and health centers. Call your local health department for information about programs in your area.  Spiritual beliefs and practices may help some smokers quit.  Download a "quit meter" on your computer to keep track of quit statistics, such as how long you have gone without smoking, cigarettes not smoked, and money saved.  Get a self-help book about quitting smoking and staying off tobacco. [place] yourself from urges to smoke. Talk to someone, go for a walk, or occupy your time with a task.  Change your normal routine. Take a different route to work. Drink tea instead of coffee. Eat breakfast in a different place.  Reduce your stress. Take a hot bath, exercise, or read a book.  Plan  something enjoyable to do every day. Reward yourself for not smoking.  Explore interactive web-based programs that specialize in helping you quit. GET MEDICINE AND USE IT CORRECTLY Medicines can help you stop smoking and decrease the urge to smoke. Combining medicine with the above behavioral methods and support can greatly increase your chances of successfully quitting smoking.  Nicotine replacement therapy helps deliver nicotine to your body without the negative effects and risks of smoking. Nicotine replacement therapy includes nicotine gum, lozenges, inhalers, nasal sprays, and skin patches. Some may be available over-the-counter and others require a prescription.  Antidepressant medicine helps people abstain from smoking, but how this works is unknown. This medicine is available by prescription.  Nicotinic receptor partial agonist medicine simulates the effect of nicotine in your brain. This medicine is available by prescription. Ask your health care provider for advice about which medicines to use and how to use them based on your health history. Your health care provider will tell you what side effects to look out for if you choose to be on a medicine or therapy. Carefully read the information on the package. Do not use any other product containing nicotine while using a nicotine replacement product.  RELAPSE OR DIFFICULT SITUATIONS Most relapses occur within the first 3 months after quitting. Do not be discouraged if you start smoking again. Remember, most people try several times before finally quitting. You may have symptoms of withdrawal because your body is used to nicotine. You may crave cigarettes, be irritable, feel very hungry, cough often, get headaches, or have difficulty concentrating. The withdrawal symptoms are only temporary. They are strongest when you first quit, but they will go away within 10-14 days. To reduce the chances of relapse, try to:  Avoid drinking alcohol.  Drinking lowers your chances of successfully quitting.  Reduce the amount of caffeine you consume. Once you quit smoking, the amount of caffeine in your body increases and can give you symptoms, such as a rapid heartbeat, sweating, and anxiety.  Avoid smokers because they can make you want to smoke.  Do not let weight gain distract you. Many smokers will gain weight when they quit, usually less than 10 pounds. Eat a healthy diet and stay active. You can always lose the weight gained after you quit.  Find ways to improve your mood other than smoking. FOR MORE INFORMATION  www.smokefree.gov  Document Released: 12/04/2001 Document Revised: 04/26/2014 Document Reviewed: 03/20/2012 [employer]  Patient Information 2015 [place]. This information is not intended to replace advice given to you by your health care provider. Make sure you discuss any questions you have with your health care provider. Pelvic Pain Female pelvic pain can be caused by many different things and start from a variety of places. Pelvic pain refers to pain that is located in the lower half of the abdomen and between your hips. The pain may occur over a short period of time (acute) or may be reoccurring (chronic). The cause of pelvic pain may be related to disorders affecting the female reproductive organs (gynecologic), but it may also be related to the bladder, kidney stones, an intestinal complication, or muscle or skeletal problems. Getting help right away for pelvic pain is important, especially if there has been severe, sharp, or a sudden onset of unusual pain. It is also important to get help right away because some types of pelvic pain can be life threatening.  CAUSES  Below are only some of the causes of pelvic pain. The causes of pelvic pain can be in one of several categories.   Gynecologic.  Pelvic inflammatory disease.  Sexually transmitted infection.  Ovarian cyst or a twisted ovarian ligament (ovarian  torsion).  Uterine lining that grows outside the uterus (endometriosis).  Fibroids, cysts, or tumors.  Ovulation.  Pregnancy.  Pregnancy that occurs outside the uterus (ectopic pregnancy).  Miscarriage.  Labor.  Abruption of the placenta or ruptured uterus.  Infection.  Uterine infection (endometritis).  Bladder infection.  Diverticulitis.  Miscarriage related to a uterine infection (septic abortion).  Bladder.  Inflammation of the bladder (cystitis).  Kidney stone(s).  Gastrointestinal.  Constipation.  Diverticulitis.  Neurologic.  Trauma.  Feeling pelvic pain because of mental or emotional causes (psychosomatic).  Cancers of the bowel or pelvis. EVALUATION  Your caregiver will want to take a careful history of your concerns. This includes recent changes in your health, a careful gynecologic history of your periods (menses), and a sexual history. Obtaining your family history and medical history is also important. Your caregiver may suggest a pelvic exam. A pelvic exam will help identify the location and severity of the pain. It also helps in the evaluation of which organ system may be involved. In order to identify the cause of the pelvic pain and be properly treated, your caregiver may order tests. These tests may include:   A pregnancy test.  Pelvic ultrasonography.  An X-ray exam of the abdomen.  A urinalysis or evaluation of vaginal discharge.  Blood tests. HOME CARE INSTRUCTIONS   Only take over-the-counter or prescription medicines for pain, discomfort, or fever as directed by your caregiver.   Rest as directed by your caregiver.   Eat a balanced diet.   Drink enough fluids to make your urine clear or pale yellow, or as directed.   Avoid sexual intercourse if it causes pain.   Apply warm or cold compresses to the lower abdomen depending on which one helps the pain.   Avoid stressful situations.   Keep a journal of your pelvic  pain. Write down when it started, where the pain is located, and if there are things that seem to be associated with the pain, such as food or your menstrual cycle.  Follow up with your caregiver as directed.  SEEK MEDICAL CARE IF:  Your medicine does not help your pain.  You have abnormal vaginal discharge. SEEK IMMEDIATE MEDICAL CARE IF:   You have heavy bleeding from the vagina.  Your pelvic pain increases.   You feel light-headed or faint.   You have chills.   You have pain with urination or blood in your urine.   You have uncontrolled diarrhea or vomiting.   You have a fever or persistent symptoms for more than 3 days.  You have a fever and your symptoms suddenly get worse.   You are being physically or sexually abused.  MAKE SURE YOU:  Understand these instructions.  Will watch your condition.  Will get help if you are not doing well or get worse. Document Released: 11/06/2004 Document Revised: 04/26/2014 Document Reviewed: 03/31/2012 [facility] Patient Information 2015 [place], [place]. This information is not intended to replace advice given to you by your health care provider. Make sure you discuss any questions you have with your health care provider. Uterine Fibroid A uterine fibroid is a growth (tumor) that occurs in your uterus. This type of tumor is not cancerous and does not spread out of the uterus. You can have one or many fibroids. Fibroids can vary in size, weight, and where they grow in the uterus. Some can become quite large. Most fibroids do not require medical treatment, but some can cause pain or heavy bleeding during and between periods. CAUSES  A fibroid is the result of a single uterine cell that keeps growing (unregulated), which is different than most cells in the human body. Most cells have a control mechanism that keeps them from reproducing without control.  SIGNS AND SYMPTOMS   Bleeding.  Pelvic pain and pressure.  Bladder problems  due to the size of the fibroid.  Infertility and miscarriages depending on the size and location of the fibroid. DIAGNOSIS  Uterine fibroids are diagnosed through a physical exam. Your health care provider may feel the lumpy tumors during a pelvic exam. Ultrasonography may be done to get information regarding size, location, and number of tumors.  TREATMENT   Your health care provider may recommend watchful waiting. This involves getting the fibroid checked by your health care provider to see if it grows or shrinks.   Hormone treatment or an intrauterine device (IUD) may be prescribed.   Surgery may be needed to remove the fibroids (myomectomy) or the uterus (hysterectomy). This depends on your situation. When fibroids interfere with fertility and a woman wants to become pregnant, a health care provider may recommend having the fibroids removed.  [place] care depends on how you were treated. In general:   Keep all follow-up appointments with your health care provider.   Only take over-the-counter or prescription medicines as directed by your health care provider. If you were prescribed a hormone treatment, take the hormone medicines exactly as directed. Do not take aspirin. It can cause bleeding.   Talk to your health care provider about taking iron pills.  If your periods are troublesome but not so heavy, lie down with your feet raised slightly above your heart. Place cold packs on your lower abdomen.   If your periods are heavy, write down the number of pads or tampons you use per month. Bring this information to your health care provider.   Include green vegetables in your diet.  SEEK IMMEDIATE MEDICAL CARE IF:  You have pelvic pain or cramps not controlled with medicines.   You have a sudden increase in pelvic pain.   You have an increase in bleeding between and during periods.   You have excessive periods and soak tampons or pads in a half hour  or less.  You feel lightheaded or have fainting episodes. Document Released: 12/07/2000 Document Revised: 09/30/2013 Document Reviewed: 07/09/2013 [facility] Patient Information 2015 [place], [place]. This information is not intended to replace advice given to you by your health care provider. Make sure you discuss any questions you have with your health care provider. Smoking Cessation Quitting smoking is important to your health and has many advantages. However, it is not always easy to quit since nicotine is a very addictive drug. Oftentimes, people try 3 times or more before being able to quit. This document explains the best ways for you to prepare to quit smoking. Quitting takes hard work and a lot of effort, but you can do it. ADVANTAGES OF QUITTING SMOKING  You will live longer, feel better, and live better.  Your body will feel the impact of quitting smoking almost immediately.  Within 20 minutes, blood pressure decreases. Your pulse returns to its normal level.  After 8 hours, carbon monoxide levels in the blood return to normal. Your oxygen level increases.  After 24 hours, the chance of having a heart attack starts to decrease. Your breath, hair, and body stop smelling like smoke.  After 48 hours, damaged nerve endings begin to recover. Your sense of taste and smell improve.  After 72 hours, the body is virtually free of nicotine. Your bronchial tubes relax and breathing becomes easier.  After 2 to 12 weeks, lungs can hold more air. Exercise becomes easier and circulation improves.  The risk of having a heart attack, stroke, cancer, or lung disease is greatly reduced.  After 1 year, the risk of coronary heart disease is cut in half.  After 5 years, the risk of stroke falls to the same as a nonsmoker.  After 10 years, the risk of lung cancer is cut in half and the risk of other cancers decreases significantly.  After 15 years, the risk of coronary heart disease drops, usually  to the level of a nonsmoker.  If you are pregnant, quitting smoking will improve your chances of having a healthy baby.  The people you live with, especially any children, will be healthier.  You will have extra money to spend on things other than cigarettes. QUESTIONS TO THINK ABOUT BEFORE ATTEMPTING TO QUIT You may want to talk about your answers with your health care provider.  Why do you want to quit?  If you tried to quit in the past, what helped and what did not?  What will be the most difficult situations for you after you quit? How will you plan to handle them?  Who can help you through the tough times? Your family? Friends? A health care provider?  What pleasures do you get from smoking? What ways can you still get pleasure if you quit? Here are some questions to ask your health care provider:  How can you help me to be successful at quitting?  What medicine do you think would be best for me and how should I take it?  What should I do if I need more help?  What is smoking withdrawal like? How can I get information on withdrawal? GET READY  Set a quit date.  Change your environment by getting rid of all cigarettes, ashtrays, matches, and lighters in your home, car, or work. Do not let people smoke in your home.  Review your past attempts to quit. Think about what worked and what did not. GET SUPPORT AND ENCOURAGEMENT You have a better chance of being successful  if you have help. You can get support in many ways.  Tell your family, friends, and coworkers that you are going to quit and need their support. Ask them not to smoke around you.  Get individual, group, or telephone counseling and support. Programs are available at [employer] and health centers. Call your local health department for information about programs in your area.  Spiritual beliefs and practices may help some smokers quit.  Download a "quit meter" on your computer to keep track of quit  statistics, such as how long you have gone without smoking, cigarettes not smoked, and money saved.  Get a self-help book about quitting smoking and staying off tobacco. Virden yourself from urges to smoke. Talk to someone, go for a walk, or occupy your time with a task.  Change your normal routine. Take a different route to work. Drink tea instead of coffee. Eat breakfast in a different place.  Reduce your stress. Take a hot bath, exercise, or read a book.  Plan something enjoyable to do every day. Reward yourself for not smoking.  Explore interactive web-based programs that specialize in helping you quit. GET MEDICINE AND USE IT CORRECTLY Medicines can help you stop smoking and decrease the urge to smoke. Combining medicine with the above behavioral methods and support can greatly increase your chances of successfully quitting smoking.  Nicotine replacement therapy helps deliver nicotine to your body without the negative effects and risks of smoking. Nicotine replacement therapy includes nicotine gum, lozenges, inhalers, nasal sprays, and skin patches. Some may be available over-the-counter and others require a prescription.  Antidepressant medicine helps people abstain from smoking, but how this works is unknown. This medicine is available by prescription.  Nicotinic receptor partial agonist medicine simulates the effect of nicotine in your brain. This medicine is available by prescription. Ask your health care provider for advice about which medicines to use and how to use them based on your health history. Your health care provider will tell you what side effects to look out for if you choose to be on a medicine or therapy. Carefully read the information on the package. Do not use any other product containing nicotine while using a nicotine replacement product.  RELAPSE OR DIFFICULT SITUATIONS Most relapses occur within the first 3 months after quitting.  Do not be discouraged if you start smoking again. Remember, most people try several times before finally quitting. You may have symptoms of withdrawal because your body is used to nicotine. You may crave cigarettes, be irritable, feel very hungry, cough often, get headaches, or have difficulty concentrating. The withdrawal symptoms are only temporary. They are strongest when you first quit, but they will go away within 10-14 days. To reduce the chances of relapse, try to:  Avoid drinking alcohol. Drinking lowers your chances of successfully quitting.  Reduce the amount of caffeine you consume. Once you quit smoking, the amount of caffeine in your body increases and can give you symptoms, such as a rapid heartbeat, sweating, and anxiety.  Avoid smokers because they can make you want to smoke.  Do not let weight gain distract you. Many smokers will gain weight when they quit, usually less than 10 pounds. Eat a healthy diet and stay active. You can always lose the weight gained after you quit.  Find ways to improve your mood other than smoking. FOR MORE INFORMATION  www.smokefree.gov  Document Released: 12/04/2001 Document Revised: 04/26/2014 Document Reviewed: 03/20/2012 [employer] Patient Information  2015 [employer]. This information is not intended to replace advice given to you by your health care provider. Make sure you discuss any questions you have with your health care provider.  

## 2014-09-27 NOTE — Progress Notes (Signed)
Patient ID: Anna Schroeder, female   DOB: Jan 17, 1981, 33 y.o.   MRN: [id number]  Chief Complaint  Patient presents with  . Advice Only    Dermoid Cyst    HPI Anna Schroeder is a 33 y.o. female.  She gives a h/o of left-sided pelvic pain with her menses for several years.  She also has heavy menses. An U/S in 2011 showed a small fibroid uterus and an adnexal mass with features suggestive of a BCT.  A repeat scan 2 mths ago showed slight enlargement of the mass. HPI  Past Medical History  Diagnosis Date  . Chlamydia   . Fibroids   . BV (bacterial vaginosis)   . Vaginal yeast infection     Past Surgical History  Procedure Laterality Date  . Tubal ligation      Family History  Problem Relation Age of Onset  . Hypertension Mother   . Heart disease Mother   . Arthritis Mother   . Alcohol abuse Father   . Arthritis Father   . Asthma Father   . Diabetes Father   . Drug abuse Father   . Heart disease Father   . Hyperlipidemia Father   . Hypertension Father   . Kidney disease Father   . Stroke Father     Social History History  Substance Use Topics  . Smoking status: Current Every Day Smoker -- 0.50 packs/day  . Smokeless tobacco: Not on file  . Alcohol Use: Yes     Comment: Occassionally    No Known Allergies  No current outpatient prescriptions on file.   No current facility-administered medications for this visit.    Review of Systems Review of Systems Constitutional: negative for fatigue and weight loss Respiratory: negative for cough and wheezing Cardiovascular: negative for chest pain, fatigue and palpitations Gastrointestinal: negative for abdominal pain and change in bowel habits Genitourinary: positive for painful, heavy menses Integument/breast: negative for nipple discharge Musculoskeletal:negative for myalgias Neurological: negative for gait problems and tremors Behavioral/Psych: negative for abusive relationship, depression Endocrine: negative for  temperature intolerance     Blood pressure 142/92, pulse 69, temperature 97 F (36.1 C), height 5\' 2"  (1.575 m), weight 62.143 kg (137 lb), last menstrual period 09/20/2014.  Physical Exam Physical Exam General:   alert  Skin:   no rash or abnormalities  Lungs:   clear to auscultation bilaterally  Heart:   regular rate and rhythm, S1, S2 normal, no murmur, click, rub or gallop  Breasts:   normal without suspicious masses, skin or nipple changes or axillary nodes  Abdomen:  normal findings: no organomegaly, soft, non-tender and no hernia  Pelvis:  External genitalia: normal general appearance Urinary system: urethral meatus normal and bladder without fullness, nontender Vaginal: normal without tenderness, induration or masses Cervix: normal appearance Adnexa: left sided, mobile mass--anterior, slightly tender Uterus: anteverted and non-tender, normal size      Data Reviewed U/S, labs Pelvic pain questionnaire  Assessment    Adnexal mass-likely a BCT Small fibroid uterus   Chronic pelvic pain--ddx ?endometriosis  Plan   Candidate for a TRH/ovarian cystectomy, possible oophorectomy/oportunistic salpingectomies Risks, benefits, alternatives to surgery reviewed Orders Placed This Encounter  Procedures  . WET PREP BY MOLECULAR PROBE  Education materials provided re: surgery, smoking cessation Follow up as needed.         JACKSON-MOORE,Shahed Yeoman A 09/27/2014, 10:21 AM

## 2014-09-28 LAB — WET PREP BY MOLECULAR PROBE
CANDIDA SPECIES: NEGATIVE
GARDNERELLA VAGINALIS: POSITIVE — AB
TRICHOMONAS VAG: NEGATIVE

## 2014-10-04 ENCOUNTER — Other Ambulatory Visit: Payer: Self-pay | Admitting: *Deleted

## 2014-10-18 ENCOUNTER — Encounter (HOSPITAL_COMMUNITY): Payer: Self-pay | Admitting: Pharmacy Technician

## 2014-10-20 NOTE — Patient Instructions (Addendum)
Anna Schroeder  10/20/2014                           YOUR PROCEDURE IS SCHEDULED ON: 10/29/14                ENTER FROM [address] - ENTER THRU EMERGENCY ENTRANCE               FOLLOW  SIGNS TO SHORT STAY CENTER                 ARRIVE AT SHORT STAY AT:  5:15 AM               CALL THIS NUMBER IF ANY PROBLEMS THE DAY OF SURGERY :               [phone]                                REMEMBER:   Do not eat food or drink liquids AFTER MIDNIGHT                  Take these medicines the morning of surgery with               A SIPS OF WATER :  none     Do not wear jewelry, make-up   Do not wear lotions, powders, or perfumes.   Do not shave legs or underarms 12 hrs. before surgery (men may shave face)  Do not bring valuables to the hospital.  Contacts, dentures or bridgework may not be worn into surgery.  Leave suitcase in the car. After surgery it may be brought to your room.  For patients admitted to the hospital more than one night, checkout time is            11:00 AM                                                       The day of discharge.   Patients discharged the day of surgery will not be allowed to drive home.            If going home same day of surgery, must have someone stay with you              FIRST 24 hrs at home and arrange for some one to drive you              home from hospital.   ________________________________________________________________________                                                                                                  [place]  Before surgery, you can play an important role.  Because skin is not sterile, your skin needs to be as free of germs as  possible.  You can reduce the number of germs on your skin by washing with CHG (chlorahexidine gluconate) soap before surgery.  CHG is an antiseptic cleaner which kills germs and bonds with the skin to continue killing germs even after  washing. Please DO NOT use if you have an allergy to CHG or antibacterial soaps.  If your skin becomes reddened/irritated stop using the CHG and inform your nurse when you arrive at Short Stay. Do not shave (including legs and underarms) for at least 48 hours prior to the first CHG shower.  You may shave your face. Please follow these instructions carefully:   1.  Shower with CHG Soap the night before surgery and the  morning of Surgery.   2.  If you choose to wash your hair, wash your hair first as usual with your  normal  Shampoo.   3.  After you shampoo, rinse your hair and body thoroughly to remove the  shampoo.                                         4.  Use CHG as you would any other liquid soap.  You can apply chg directly  to the skin and wash . Gently wash with scrungie or clean wascloth    5.  Apply the CHG Soap to your body ONLY FROM THE NECK DOWN.   Do not use on open                           Wound or open sores. Avoid contact with eyes, ears mouth and genitals (private parts).                        Genitals (private parts) with your normal soap.              6.  Wash thoroughly, paying special attention to the area where your surgery  will be performed.   7.  Thoroughly rinse your body with warm water from the neck down.   8.  DO NOT shower/wash with your normal soap after using and rinsing off  the CHG Soap .                9.  Pat yourself dry with a clean towel.             10.  Wear clean pajamas.             11.  Place clean sheets on your bed the night of your first shower and do not  sleep with pets.  Day of Surgery : Do not apply any lotions/deodorants the morning of surgery.  Please wear clean clothes to the hospital/surgery center.  FAILURE TO FOLLOW THESE INSTRUCTIONS MAY RESULT IN THE CANCELLATION OF YOUR SURGERY    PATIENT SIGNATURE_________________________________  ______________________________________________________________________

## 2014-10-21 ENCOUNTER — Encounter (HOSPITAL_COMMUNITY)
Admission: RE | Admit: 2014-10-21 | Discharge: 2014-10-21 | Disposition: A | Discharge status: Home / Self Care | Payer: Medicaid Other | Source: Ambulatory Visit | Attending: Obstetrics & Gynecology | Admitting: Obstetrics & Gynecology

## 2014-10-21 ENCOUNTER — Encounter (HOSPITAL_COMMUNITY): Payer: Self-pay

## 2014-10-21 DIAGNOSIS — Z01812 Encounter for preprocedural laboratory examination: Secondary | ICD-10-CM | POA: Diagnosis present

## 2014-10-21 HISTORY — DX: Family history of other specified conditions: Z84.89

## 2014-10-21 HISTORY — DX: Intra-abdominal and pelvic swelling, mass and lump, unspecified site: R19.00

## 2014-10-21 LAB — CBC
HEMATOCRIT: 34.7 % — AB (ref 36.0–46.0)
Hemoglobin: 12.5 g/dL (ref 12.0–15.0)
MCH: 30 pg (ref 26.0–34.0)
MCHC: 36 g/dL (ref 30.0–36.0)
MCV: 83.2 fL (ref 78.0–100.0)
PLATELETS: 197 10*3/uL (ref 150–400)
RBC: 4.17 MIL/uL (ref 3.87–5.11)
RDW: 13.7 % (ref 11.5–15.5)
WBC: 6.7 10*3/uL (ref 4.0–10.5)

## 2014-10-21 LAB — HCG, SERUM, QUALITATIVE: Preg, Serum: NEGATIVE

## 2014-10-21 LAB — ABO/RH: ABO/RH(D): O POS

## 2014-10-25 ENCOUNTER — Encounter (HOSPITAL_COMMUNITY): Payer: Self-pay

## 2014-10-27 NOTE — H&P (Signed)
Subjective:  Anna Schroeder is a 33 y.o.  female.  I was consulted regarding an adnexal mass/heavy menses/pelvic pain.  Onset of symptoms was gradual starting several years ago with unchanged course since that time. Bleeding is characterized as heavy. Associated symptoms include pelvic pain.  Evaluation to date: pelvic ultrasound: positive for uterine fibroids, unilateral adnexal mass with features suggestive of a BCT.   Pertinent Gyn History:  Menses flow is heavy Contraception: tubal ligation Preventive screening:  Last pap: normal Date: 2014  Patient Active Problem List   Diagnosis Date Noted  . Pelvic pain in female 09/27/2014  . Intramural leiomyoma of uterus 09/27/2014  . Abnormal uterine bleeding 09/27/2014  . Adnexal mass 09/27/2014  . OBESITY, NOS 02/20/2007   Past Medical History  Diagnosis Date  . Chlamydia   . Fibroids   . BV (bacterial vaginosis)   . Vaginal yeast infection   . Family history of anesthesia complication     parents were slow to wake up  . Pelvic mass     Past Surgical History  Procedure Laterality Date  . Tubal ligation      No prescriptions prior to admission   No Known Allergies  History  Substance Use Topics  . Smoking status: Current Every Day Smoker -- 0.50 packs/day  . Smokeless tobacco: Not on file  . Alcohol Use: Yes     Comment: Occassionally    Family History  Problem Relation Age of Onset  . Hypertension Mother   . Heart disease Mother   . Arthritis Mother   . Alcohol abuse Father   . Arthritis Father   . Asthma Father   . Diabetes Father   . Drug abuse Father   . Heart disease Father   . Hyperlipidemia Father   . Hypertension Father   . Kidney disease Father   . Stroke Father      Review of Systems Pertinent items are noted in HPI.    Objective:   General:   alert  Skin:   no rash or abnormalities  Lungs:   clear to auscultation bilaterally  Heart:   regular rate and rhythm, S1, S2 normal, no murmur, click, rub  or gallop  Breasts:   normal without suspicious masses, skin or nipple changes or axillary nodes  Abdomen:  normal findings: no organomegaly, soft, non-tender and no hernia  Pelvis:  External genitalia: normal general appearance Urinary system: urethral meatus normal and bladder without fullness, nontender Vaginal: normal without tenderness, induration or masses Cervix: normal appearance Adnexa: right side no masses; left-sided fullness Uterus: anteverted and non-tender, normal size         Assessment/Plan:  Left-sided adnexal mass with radiologic features suggestive of BCT; small fibroid uterus with secondary abnormal uterine bleeding  A total robotic-assisted hysterectomy, ovarian cystectomy, opportunistic salpingectomies, possible oophorectomy is planned   I had a lengthy discussion with the patient regarding her bleeding and consideration for endometrial ablation versus hysterectomy.  Procedure, risks, reasons, benefits and complications (including injury to bowel, bladder, major blood vessel, ureter, bleeding, possibility of transfusion, infection, thromboembolism or fistula formation) were reviewed in detail. Consent was signed and preop testing was ordered.  Instructions were reviewed, including NPO after midnight.

## 2014-10-28 LAB — PROCEDURE REPORT - SCANNED: Pap: NEGATIVE

## 2014-10-28 NOTE — Anesthesia Preprocedure Evaluation (Addendum)
Anesthesia Evaluation  Patient identified by MRN, date of birth, ID band Patient awake    Reviewed: Allergy & Precautions, H&P , NPO status , Patient's Chart, lab work & pertinent test results  Airway Mallampati: II  TM Distance: >3 FB Neck ROM: full    Dental no notable dental hx. (+) Teeth Intact, Dental Advisory Given   Pulmonary neg pulmonary ROS, Current Smoker,  breath sounds clear to auscultation  Pulmonary exam normal       Cardiovascular Exercise Tolerance: Good negative cardio ROS  Rhythm:regular Rate:Normal     Neuro/Psych negative neurological ROS  negative psych ROS   GI/Hepatic negative GI ROS, Neg liver ROS,   Endo/Other  negative endocrine ROS  Renal/GU negative Renal ROS  negative genitourinary   Musculoskeletal   Abdominal   Peds  Hematology negative hematology ROS (+)   Anesthesia Other Findings   Reproductive/Obstetrics negative OB ROS                            Anesthesia Physical Anesthesia Plan  ASA: II  Anesthesia Plan: General   Post-op Pain Management:    Induction: Intravenous  Airway Management Planned: Oral ETT  Additional Equipment:   Intra-op Plan:   Post-operative Plan: Extubation in OR  Informed Consent: I have reviewed the patients History and Physical, chart, labs and discussed the procedure including the risks, benefits and alternatives for the proposed anesthesia with the patient or authorized representative who has indicated his/her understanding and acceptance.   Dental Advisory Given  Plan Discussed with: CRNA and Surgeon  Anesthesia Plan Comments:         Anesthesia Quick Evaluation

## 2014-10-29 ENCOUNTER — Inpatient Hospital Stay (HOSPITAL_COMMUNITY)
Admission: RE | Admit: 2014-10-29 | Discharge: 2014-10-30 | DRG: 743 | Disposition: A | Discharge status: Home / Self Care | Payer: Medicaid Other | Source: Ambulatory Visit | Attending: Obstetrics & Gynecology | Admitting: Obstetrics & Gynecology

## 2014-10-29 ENCOUNTER — Encounter (HOSPITAL_COMMUNITY)
Admission: RE | Disposition: A | Discharge status: Home / Self Care | Payer: Self-pay | Source: Ambulatory Visit | Attending: Obstetrics & Gynecology

## 2014-10-29 ENCOUNTER — Ambulatory Visit (HOSPITAL_COMMUNITY): Payer: Medicaid Other | Admitting: Anesthesiology

## 2014-10-29 ENCOUNTER — Encounter (HOSPITAL_COMMUNITY): Payer: Self-pay | Admitting: *Deleted

## 2014-10-29 DIAGNOSIS — N809 Endometriosis, unspecified: Secondary | ICD-10-CM | POA: Diagnosis present

## 2014-10-29 DIAGNOSIS — N8320 Unspecified ovarian cysts: Secondary | ICD-10-CM | POA: Diagnosis present

## 2014-10-29 DIAGNOSIS — D259 Leiomyoma of uterus, unspecified: Secondary | ICD-10-CM

## 2014-10-29 DIAGNOSIS — Z01812 Encounter for preprocedural laboratory examination: Secondary | ICD-10-CM

## 2014-10-29 DIAGNOSIS — N939 Abnormal uterine and vaginal bleeding, unspecified: Secondary | ICD-10-CM | POA: Diagnosis present

## 2014-10-29 DIAGNOSIS — N9489 Other specified conditions associated with female genital organs and menstrual cycle: Secondary | ICD-10-CM

## 2014-10-29 DIAGNOSIS — G8929 Other chronic pain: Secondary | ICD-10-CM | POA: Diagnosis present

## 2014-10-29 DIAGNOSIS — K429 Umbilical hernia without obstruction or gangrene: Secondary | ICD-10-CM | POA: Diagnosis present

## 2014-10-29 DIAGNOSIS — F17210 Nicotine dependence, cigarettes, uncomplicated: Secondary | ICD-10-CM | POA: Diagnosis present

## 2014-10-29 DIAGNOSIS — N858 Other specified noninflammatory disorders of uterus: Secondary | ICD-10-CM

## 2014-10-29 DIAGNOSIS — R1900 Intra-abdominal and pelvic swelling, mass and lump, unspecified site: Secondary | ICD-10-CM | POA: Diagnosis present

## 2014-10-29 DIAGNOSIS — K660 Peritoneal adhesions (postprocedural) (postinfection): Secondary | ICD-10-CM | POA: Diagnosis present

## 2014-10-29 DIAGNOSIS — D251 Intramural leiomyoma of uterus: Secondary | ICD-10-CM | POA: Diagnosis present

## 2014-10-29 HISTORY — PX: ROBOTIC ASSISTED TOTAL HYSTERECTOMY: SHX6085

## 2014-10-29 LAB — TYPE AND SCREEN
ABO/RH(D): O POS
ANTIBODY SCREEN: NEGATIVE

## 2014-10-29 SURGERY — ROBOTIC ASSISTED TOTAL HYSTERECTOMY
Anesthesia: General | Site: Abdomen

## 2014-10-29 MED ORDER — ZOLPIDEM TARTRATE 5 MG PO TABS: 5.0000 mg | ORAL_TABLET | Freq: Every evening | ORAL | Status: DC | PRN

## 2014-10-29 MED ORDER — ROCURONIUM BROMIDE 100 MG/10ML IV SOLN
INTRAVENOUS | Status: DC | PRN
  Administered 2014-10-29: 10 mg via INTRAVENOUS
  Administered 2014-10-29: 30 mg via INTRAVENOUS

## 2014-10-29 MED ORDER — PROMETHAZINE HCL 25 MG/ML IJ SOLN
6.2500 mg | INTRAMUSCULAR | Status: DC | PRN
  Administered 2014-10-29: 6.25 mg via INTRAVENOUS

## 2014-10-29 MED ORDER — GLYCOPYRROLATE 0.2 MG/ML IJ SOLN
INTRAMUSCULAR | Status: DC | PRN
  Administered 2014-10-29: 0.4 mg via INTRAVENOUS

## 2014-10-29 MED ORDER — CEFAZOLIN SODIUM-DEXTROSE 2-3 GM-% IV SOLR
2.0000 g | INTRAVENOUS | Status: AC
  Administered 2014-10-29: 2 g via INTRAVENOUS

## 2014-10-29 MED ORDER — CEFAZOLIN SODIUM-DEXTROSE 2-3 GM-% IV SOLR
INTRAVENOUS | Status: AC
  Filled 2014-10-29: qty 50

## 2014-10-29 MED ORDER — HYDROMORPHONE HCL 1 MG/ML IJ SOLN
INTRAMUSCULAR | Status: DC | PRN
  Administered 2014-10-29: .5 mg via INTRAVENOUS
  Administered 2014-10-29: 0.5 mg via INTRAVENOUS
  Administered 2014-10-29: 1 mg via INTRAVENOUS

## 2014-10-29 MED ORDER — FENTANYL CITRATE 0.05 MG/ML IJ SOLN
INTRAMUSCULAR | Status: AC
  Filled 2014-10-29: qty 5

## 2014-10-29 MED ORDER — HYDROMORPHONE HCL 1 MG/ML IJ SOLN
0.2500 mg | INTRAMUSCULAR | Status: DC | PRN
Start: 2014-10-29 — End: 2014-10-29

## 2014-10-29 MED ORDER — MIDAZOLAM HCL 2 MG/2ML IJ SOLN
INTRAMUSCULAR | Status: AC
  Filled 2014-10-29: qty 2

## 2014-10-29 MED ORDER — PANTOPRAZOLE SODIUM 40 MG PO TBEC
40.0000 mg | DELAYED_RELEASE_TABLET | Freq: Every day | ORAL | Status: DC
  Administered 2014-10-29 – 2014-10-30 (×2): 40 mg via ORAL
  Filled 2014-10-29 (×2): qty 1

## 2014-10-29 MED ORDER — ONDANSETRON HCL 4 MG/2ML IJ SOLN
INTRAMUSCULAR | Status: DC | PRN
  Administered 2014-10-29: 4 mg via INTRAVENOUS

## 2014-10-29 MED ORDER — DEXAMETHASONE SODIUM PHOSPHATE 10 MG/ML IJ SOLN
INTRAMUSCULAR | Status: DC | PRN
  Administered 2014-10-29: 10 mg via INTRAVENOUS

## 2014-10-29 MED ORDER — DEXAMETHASONE SODIUM PHOSPHATE 10 MG/ML IJ SOLN
INTRAMUSCULAR | Status: AC
  Filled 2014-10-29: qty 1

## 2014-10-29 MED ORDER — LACTATED RINGERS IV SOLN
INTRAVENOUS | Status: DC
  Administered 2014-10-29: 11:00:00 via INTRAVENOUS

## 2014-10-29 MED ORDER — LIDOCAINE HCL (CARDIAC) 20 MG/ML IV SOLN
INTRAVENOUS | Status: DC | PRN
  Administered 2014-10-29: 100 mg via INTRAVENOUS

## 2014-10-29 MED ORDER — ROCURONIUM BROMIDE 100 MG/10ML IV SOLN
INTRAVENOUS | Status: AC
  Filled 2014-10-29: qty 1

## 2014-10-29 MED ORDER — SIMETHICONE 80 MG PO CHEW
80.0000 mg | CHEWABLE_TABLET | Freq: Four times a day (QID) | ORAL | Status: DC | PRN
  Filled 2014-10-29: qty 1

## 2014-10-29 MED ORDER — KETOROLAC TROMETHAMINE 30 MG/ML IJ SOLN
30.0000 mg | Freq: Four times a day (QID) | INTRAMUSCULAR | Status: DC
  Filled 2014-10-29 (×5): qty 1

## 2014-10-29 MED ORDER — OXYCODONE HCL 5 MG PO TABS
10.0000 mg | ORAL_TABLET | ORAL | Status: DC | PRN
  Administered 2014-10-29 – 2014-10-30 (×3): 10 mg via ORAL
  Filled 2014-10-29 (×3): qty 2

## 2014-10-29 MED ORDER — MENTHOL 3 MG MT LOZG: 1.0000 | LOZENGE | OROMUCOSAL | Status: DC | PRN

## 2014-10-29 MED ORDER — PROMETHAZINE HCL 25 MG/ML IJ SOLN
INTRAMUSCULAR | Status: AC
  Filled 2014-10-29: qty 1

## 2014-10-29 MED ORDER — METRONIDAZOLE IN NACL 5-0.79 MG/ML-% IV SOLN
500.0000 mg | Freq: Once | INTRAVENOUS | Status: AC
  Administered 2014-10-29: 500 mg via INTRAVENOUS

## 2014-10-29 MED ORDER — KETOROLAC TROMETHAMINE 30 MG/ML IJ SOLN
30.0000 mg | Freq: Once | INTRAMUSCULAR | Status: AC
  Filled 2014-10-29: qty 1

## 2014-10-29 MED ORDER — LACTATED RINGERS IV SOLN
INTRAVENOUS | Status: DC | PRN
  Administered 2014-10-29 (×2): via INTRAVENOUS

## 2014-10-29 MED ORDER — NEOSTIGMINE METHYLSULFATE 10 MG/10ML IV SOLN
INTRAVENOUS | Status: AC
  Filled 2014-10-29: qty 1

## 2014-10-29 MED ORDER — ONDANSETRON HCL 4 MG/2ML IJ SOLN: 4.0000 mg | Freq: Four times a day (QID) | INTRAMUSCULAR | Status: DC | PRN

## 2014-10-29 MED ORDER — LIDOCAINE HCL (CARDIAC) 20 MG/ML IV SOLN
INTRAVENOUS | Status: AC
  Filled 2014-10-29: qty 5

## 2014-10-29 MED ORDER — MIDAZOLAM HCL 5 MG/5ML IJ SOLN
INTRAMUSCULAR | Status: DC | PRN
  Administered 2014-10-29: 2 mg via INTRAVENOUS

## 2014-10-29 MED ORDER — MEPERIDINE HCL 50 MG/ML IJ SOLN
INTRAMUSCULAR | Status: AC
  Filled 2014-10-29: qty 1

## 2014-10-29 MED ORDER — METRONIDAZOLE IN NACL 5-0.79 MG/ML-% IV SOLN
INTRAVENOUS | Status: AC
  Filled 2014-10-29: qty 100

## 2014-10-29 MED ORDER — FENTANYL CITRATE 0.05 MG/ML IJ SOLN
INTRAMUSCULAR | Status: DC | PRN
  Administered 2014-10-29: 50 ug via INTRAVENOUS
  Administered 2014-10-29: 100 ug via INTRAVENOUS
  Administered 2014-10-29 (×2): 50 ug via INTRAVENOUS

## 2014-10-29 MED ORDER — STERILE WATER FOR IRRIGATION IR SOLN
Status: DC | PRN
  Administered 2014-10-29: 3000 mL

## 2014-10-29 MED ORDER — MEPERIDINE HCL 50 MG/ML IJ SOLN
6.2500 mg | INTRAMUSCULAR | Status: DC | PRN
  Administered 2014-10-29: 12.5 mg via INTRAVENOUS

## 2014-10-29 MED ORDER — KETOROLAC TROMETHAMINE 30 MG/ML IJ SOLN
30.0000 mg | Freq: Four times a day (QID) | INTRAMUSCULAR | Status: DC
  Administered 2014-10-29 – 2014-10-30 (×4): 30 mg via INTRAVENOUS
  Filled 2014-10-29 (×6): qty 1

## 2014-10-29 MED ORDER — ONDANSETRON HCL 4 MG/2ML IJ SOLN
INTRAMUSCULAR | Status: AC
  Filled 2014-10-29: qty 2

## 2014-10-29 MED ORDER — HYDROMORPHONE HCL 2 MG/ML IJ SOLN
INTRAMUSCULAR | Status: AC
  Filled 2014-10-29: qty 1

## 2014-10-29 MED ORDER — LACTATED RINGERS IR SOLN
Status: DC | PRN
  Administered 2014-10-29: 1000 mL

## 2014-10-29 MED ORDER — SUCCINYLCHOLINE CHLORIDE 20 MG/ML IJ SOLN
INTRAMUSCULAR | Status: DC | PRN
  Administered 2014-10-29: 100 mg via INTRAVENOUS

## 2014-10-29 MED ORDER — IBUPROFEN 800 MG PO TABS: 800.0000 mg | ORAL_TABLET | Freq: Three times a day (TID) | ORAL | Status: DC | PRN

## 2014-10-29 MED ORDER — PROPOFOL 10 MG/ML IV BOLUS
INTRAVENOUS | Status: DC | PRN
  Administered 2014-10-29: 200 mg via INTRAVENOUS

## 2014-10-29 MED ORDER — PROPOFOL 10 MG/ML IV BOLUS
INTRAVENOUS | Status: AC
  Filled 2014-10-29: qty 20

## 2014-10-29 MED ORDER — DEXTROSE-NACL 5-0.45 % IV SOLN
INTRAVENOUS | Status: DC
  Administered 2014-10-29 – 2014-10-30 (×3): via INTRAVENOUS

## 2014-10-29 MED ORDER — GLYCOPYRROLATE 0.2 MG/ML IJ SOLN
INTRAMUSCULAR | Status: AC
  Filled 2014-10-29: qty 2

## 2014-10-29 MED ORDER — NEOSTIGMINE METHYLSULFATE 10 MG/10ML IV SOLN
INTRAVENOUS | Status: DC | PRN
  Administered 2014-10-29: 3 mg via INTRAVENOUS

## 2014-10-29 MED ORDER — HYDROMORPHONE HCL 1 MG/ML IJ SOLN
0.2000 mg | INTRAMUSCULAR | Status: DC | PRN
  Administered 2014-10-29: 0.6 mg via INTRAVENOUS
  Administered 2014-10-29: 0.5 mg via INTRAVENOUS
  Filled 2014-10-29 (×2): qty 1

## 2014-10-29 MED ORDER — ONDANSETRON HCL 4 MG PO TABS: 4.0000 mg | ORAL_TABLET | Freq: Four times a day (QID) | ORAL | Status: DC | PRN

## 2014-10-29 MED ORDER — MAGNESIUM HYDROXIDE 400 MG/5ML PO SUSP
30.0000 mL | Freq: Two times a day (BID) | ORAL | Status: AC
  Administered 2014-10-29 – 2014-10-30 (×2): 30 mL via ORAL
  Filled 2014-10-29 (×2): qty 30

## 2014-10-29 SURGICAL SUPPLY — 63 items
APL SKNCLS STERI-STRIP NONHPOA (GAUZE/BANDAGES/DRESSINGS)
APPLIER CLIP 5 13 M/L LIGAMAX5 (MISCELLANEOUS) ×3
APR CLP MED LRG 5 ANG JAW (MISCELLANEOUS) ×2
BAG SPEC RTRVL LRG 6X4 10 (ENDOMECHANICALS) ×2
BENZOIN TINCTURE PRP APPL 2/3 (GAUZE/BANDAGES/DRESSINGS) IMPLANT
CHLORAPREP W/TINT 10.5 ML (MISCELLANEOUS) ×1 IMPLANT
CHLORAPREP W/TINT 26ML (MISCELLANEOUS) ×3 IMPLANT
CLIP APPLIE 5 13 M/L LIGAMAX5 (MISCELLANEOUS) ×1 IMPLANT
CLIP LIGATING HEMO O LOK GREEN (MISCELLANEOUS) ×2 IMPLANT
CORD HIGH FREQUENCY UNIPOLAR (ELECTROSURGICAL) IMPLANT
CORDS BIPOLAR (ELECTRODE) ×5 IMPLANT
COVER SURGICAL LIGHT HANDLE (MISCELLANEOUS) ×4 IMPLANT
COVER TIP SHEARS 8 DVNC (MISCELLANEOUS) ×2 IMPLANT
COVER TIP SHEARS 8MM DA VINCI (MISCELLANEOUS) ×1
DECANTER SPIKE VIAL GLASS SM (MISCELLANEOUS) ×1 IMPLANT
DRAPE SHEET LG 3/4 BI-LAMINATE (DRAPES) ×6 IMPLANT
DRAPE SURG IRRIG POUCH 19X23 (DRAPES) ×3 IMPLANT
DRAPE TABLE BACK 44X90 PK DISP (DRAPES) ×6 IMPLANT
DRAPE UTILITY XL STRL (DRAPES) ×3 IMPLANT
DRAPE WARM FLUID 44X44 (DRAPE) ×3 IMPLANT
DRSG TEGADERM 2-3/8X2-3/4 SM (GAUZE/BANDAGES/DRESSINGS) ×1 IMPLANT
DRSG TEGADERM 4X4.75 (GAUZE/BANDAGES/DRESSINGS) ×1 IMPLANT
DRSG TEGADERM 6X8 (GAUZE/BANDAGES/DRESSINGS) ×7 IMPLANT
ELECT REM PT RETURN 9FT ADLT (ELECTROSURGICAL) ×3
ELECTRODE REM PT RTRN 9FT ADLT (ELECTROSURGICAL) ×2 IMPLANT
FILTER SMOKE EVAC LAPAROSHD (FILTER) ×1 IMPLANT
GAUZE SPONGE 2X2 8PLY STRL LF (GAUZE/BANDAGES/DRESSINGS) ×2 IMPLANT
GLOVE BIO SURGEON STRL SZ 6.5 (GLOVE) ×9 IMPLANT
GLOVE BIO SURGEON STRL SZ8 (GLOVE) ×6 IMPLANT
GOWN STRL REUS W/ TWL XL LVL3 (GOWN DISPOSABLE) ×4 IMPLANT
GOWN STRL REUS W/TWL LRG LVL3 (GOWN DISPOSABLE) ×12 IMPLANT
GOWN STRL REUS W/TWL XL LVL3 (GOWN DISPOSABLE) ×6
HOLDER FOLEY CATH W/STRAP (MISCELLANEOUS) ×3 IMPLANT
KIT ACCESSORY DA VINCI DISP (KITS) ×1
KIT ACCESSORY DVNC DISP (KITS) ×2 IMPLANT
LIQUID BAND (GAUZE/BANDAGES/DRESSINGS) ×3 IMPLANT
MANIPULATOR UTERINE 4.5 ZUMI (MISCELLANEOUS) ×3 IMPLANT
OCCLUDER COLPOPNEUMO (BALLOONS) ×3 IMPLANT
PENCIL BUTTON HOLSTER BLD 10FT (ELECTRODE) ×1 IMPLANT
POUCH SPECIMEN RETRIEVAL 10MM (ENDOMECHANICALS) ×2 IMPLANT
SET TUBE IRRIG SUCTION NO TIP (IRRIGATION / IRRIGATOR) ×3 IMPLANT
SHEET LAVH (DRAPES) ×3 IMPLANT
SOLUTION ELECTROLUBE (MISCELLANEOUS) ×3 IMPLANT
SPONGE GAUZE 2X2 STER 10/PKG (GAUZE/BANDAGES/DRESSINGS)
SPONGE LAP 18X18 X RAY DECT (DISPOSABLE) IMPLANT
STRIP CLOSURE SKIN 1/2X4 (GAUZE/BANDAGES/DRESSINGS) IMPLANT
SUT VIC AB 0 CT1 27 (SUTURE) ×6
SUT VIC AB 0 CT1 27XBRD ANTBC (SUTURE) ×7 IMPLANT
SUT VIC AB 4-0 PS2 27 (SUTURE) ×4 IMPLANT
SUT VICRYL 0 UR6 27IN ABS (SUTURE) ×4 IMPLANT
SYR 20CC LL (SYRINGE) ×6 IMPLANT
SYR 50ML LL SCALE MARK (SYRINGE) ×3 IMPLANT
SYR BULB IRRIGATION 50ML (SYRINGE) ×3 IMPLANT
TOWEL OR 17X26 10 PK STRL BLUE (TOWEL DISPOSABLE) ×6 IMPLANT
TOWEL OR NON WOVEN STRL DISP B (DISPOSABLE) ×1 IMPLANT
TRAP SPECIMEN MUCOUS 40CC (MISCELLANEOUS) ×1 IMPLANT
TRAY FOLEY CATH 14FRSI W/METER (CATHETERS) ×3 IMPLANT
TRAY LAPAROSCOPIC (CUSTOM PROCEDURE TRAY) ×3 IMPLANT
TROCAR 12M 150ML BLUNT (TROCAR) ×3 IMPLANT
TROCAR XCEL 12X100 BLDLESS (ENDOMECHANICALS) ×3 IMPLANT
TROCAR XCEL NON-BLD 5MMX100MML (ENDOMECHANICALS) ×6 IMPLANT
TUBING INSUFFLATION 10FT LAP (TUBING) ×3 IMPLANT
WATER STERILE IRR 1500ML POUR (IV SOLUTION) ×6 IMPLANT

## 2014-10-29 NOTE — Interval H&P Note (Signed)
History and Physical Interval Note:  10/29/2014 7:23 AM  Anna Schroeder  has presented today for surgery, with the diagnosis of Pelvic mass/ uterine fibroids  [phone]  The various methods of treatment have been discussed with the patient and family. After consideration of risks, benefits and other options for treatment, the patient has consented to  Procedure(s) with comments: ROBOTIC ASSISTED TOTAL HYSTERECTOMY (N/A) ROBOTIC ASSISTED LAPAROSCOPIC OVARIAN CYSTECTOMY possible LSO (Left) - possible LSO as a surgical intervention .  The patient's history has been reviewed, patient examined, no change in status, stable for surgery.  I have reviewed the patient's chart and labs.  Questions were answered to the patient's satisfaction.     JACKSON-MOORE,Cairo Lingenfelter A

## 2014-10-29 NOTE — Op Note (Signed)
Pre-operative Diagnosis: abnormal uterine bleeding, adnexal mass, fibroids and pelvic pain  Post-operative Diagnosis: same, endometriosis  Operation: Robotic-assisted hysterectomy with lysis of adhesions, ablation of endometriotic implant, right salpingectomy; left salpingo oophorectomy  Surgeon: Agnes Lawrence  Assistant: Baltazar Najjar, MD  Anesthesia: GET  Urine Output: Per Anesthesiology  Findings:  Omental adhesions to a periumbilical hernia, left pelvic sidewall, left ovarian cyst, uterine fibroids  Estimated Blood Loss:  200 mL                 Total IV Fluids: per Anesthesiology         Specimens: PATHOLOGY               Complications:  None; patient tolerated the procedure well.         Disposition: PACU - hemodynamically stable.         Condition: Stable    Procedure Details  The patient was seen in the Holding Room. The risks, benefits, complications, treatment options, and expected outcomes were discussed with the patient.  The patient concurred with the proposed plan, giving informed consent.  The site of surgery properly noted/marked. The patient was identified as Anna Schroeder and the procedure verified as a Robotic-assisted hysterectomy with bilateral salpingectomy. A Time Out was held and the above information confirmed.  After induction of anesthesia, the patient was draped and prepped in the usual sterile manner. Pt was placed in supine position after anesthesia and draped and prepped in the usual sterile manner. The abdominal drape was placed after the CholoraPrep had been allowed to dry for 3 minutes.  Her arms were tucked to her side with all appropriate precautions.  The shoulder blocks were placed in the usual fashion.  The patient was placed in the semi-lithotomy position in [place].  The perineum was prepped with Betadine.  Foley catheter was placed.  A sterile speculum was placed in the vagina.  The cervix was grasped with a single-tooth  tenaculum and dilated with Kennon Rounds dilators.  The ZUMI uterine manipulator with a medium colpotomizer ring was placed without difficulty.  A pneum occluder balloon was placed over the manipulator.  A second time-out was performed.  OG tube placement was confirmed and to suction.  Approximately 2 cm below the costal margin, in the midclavicular line the skin was anesthestized with 0.25% Marcaine.  A 5 mm incision was made and using a 5 mm Optiview, a 5 mm trocar was placed under direct vision.  The patient's abdomen was insufflated with CO2 gas.  At this point and all points during the procedure, the patient's intra-abdominal pressure did not exceed 15 mmHg.  A 10-12 camera port was place 24 cm above the pubic symphysis.  Bilateral 8 mm ports were place 10 cm and 15 degrees inferior.  All ports were placed under direct visualization.  The above noted adhesions were divided with monopolar cautery.  The 5 mm port was removed.  The incision was extended to accommodate a 10 mm trocar.  A 10 mm trocar and sleeve were advanced under direct visualization.   The robot was docked in the usual fashion.  The right mesosalpinx was divided with monopolar cautery.  The proximal tube was coagulated with bipolar cautery and transected.  The fallopian tube was removed from the abdomen.  The round ligament on the right was transected with monopolar cautery.  The anterior and posterior leaves of the broad ligament were opened.  A window was made in broad ligament inferior to  the utero ovarian ligament.  The right utero-ovarian ligament and proximal fallopian tube were  coagulated with bipolar cautery and transected.  The uterine vessels were skeletonized to the level of the Koh ring. The uterine vessels were coagulated with bipolar cautery and transected.   A C-loop was created in the usual fashion.  A similar procedure was performed on the patient's left side. The round ligament on the left was transected with monopolar cautery.  The  anterior and posterior leaves of the broad ligament were opened.  The ureter was identified.  A window was made between the infundibulopelvic ligament and the ureter.  The left infundibulo-pelvic ligament  was coagulated with bipolar cautery and transected.  The uterine vessels were skeletonized to the level of the Koh ring. The uterine vessels were coagulated with bipolar cautery and transected.  A C-loop was created in the usual fashion.   The bladder flap was completed.  At this time, the pneum occluder balloon was insufflated and a colpotomy was performed.  There was a branch of of the uterine artery at the vaginal angle on the right that was bleeding.  This was isolated and coagulated with monopolar cautery.  The uterus, cervix, bilateral tubes and ovaries were delivered through the vagina.  All pedicles were inspected under low intraabdominal pressures and noted to be hemostatic.  The vagina cuff was closed using 0-Vicryl on a CT-1 needle in a running fashion.  The above noted endometriotic plaque was ablated with monopolar cautery.  The abdomen and pelvis were copiously irrigated.  The needle was removed without difficulty.  The instruments were then removed under direct visualization and the robotic ports removed.  The robot was undocked.  Deep, subcutaneous, figure-of-eight 0-Vicryl sutures on a UR-6 needle were placed in the 81-10 mm supraumbilical and infracostal incisions.  All skin incisions were closed in a subcuticular fashion using 3-0 Monocryl.  A skin adhesive was applied.  The vagina was swabbed with minimal bleeding noted.   All instrument and needle counts were correct x  2.

## 2014-10-29 NOTE — Anesthesia Postprocedure Evaluation (Signed)
  Anesthesia Post-op Note  Patient: Anna Schroeder  Procedure(s) Performed: Procedure(s) (LRB): ROBOTIC ASSISTED TOTAL HYSTERECTOMY, LEFT SALPINGOOOPHERECTOMY, RIGHT SALPINGECTOMY (N/A)  Patient Location: PACU  Anesthesia Type: General  Level of Consciousness: awake and alert   Airway and Oxygen Therapy: Patient Spontanous Breathing  Post-op Pain: mild  Post-op Assessment: Post-op Vital signs reviewed, Patient's Cardiovascular Status Stable, Respiratory Function Stable, Patent Airway and No signs of Nausea or vomiting  Last Vitals:  Filed Vitals:   10/29/14 1224  BP: 161/91  Pulse: 57  Temp: 36.7 C  Resp: 16    Post-op Vital Signs: stable   Complications: No apparent anesthesia complications

## 2014-10-29 NOTE — Transfer of Care (Signed)
Immediate Anesthesia Transfer of Care Note  Patient: Anna Schroeder  Procedure(s) Performed: Procedure(s): ROBOTIC ASSISTED TOTAL HYSTERECTOMY, LEFT SALPINGOOOPHERECTOMY, RIGHT SALPINGECTOMY (N/A)  Patient Location: PACU  Anesthesia Type:General  Level of Consciousness: sedated  Airway & Oxygen Therapy: Patient Spontanous Breathing and Patient connected to face mask oxygen  Post-op Assessment: Report given to PACU RN and Post -op Vital signs reviewed and stable  Post vital signs: Reviewed and stable  Complications: No apparent anesthesia complications

## 2014-10-30 DIAGNOSIS — N809 Endometriosis, unspecified: Secondary | ICD-10-CM | POA: Diagnosis present

## 2014-10-30 LAB — CBC
HEMATOCRIT: 30.1 % — AB (ref 36.0–46.0)
HEMOGLOBIN: 10.9 g/dL — AB (ref 12.0–15.0)
MCH: 29.9 pg (ref 26.0–34.0)
MCHC: 36.2 g/dL — ABNORMAL HIGH (ref 30.0–36.0)
MCV: 82.7 fL (ref 78.0–100.0)
Platelets: 177 10*3/uL (ref 150–400)
RBC: 3.64 MIL/uL — AB (ref 3.87–5.11)
RDW: 13.4 % (ref 11.5–15.5)
WBC: 17.2 10*3/uL — AB (ref 4.0–10.5)

## 2014-10-30 LAB — BASIC METABOLIC PANEL
ANION GAP: 9 (ref 5–15)
BUN: 11 mg/dL (ref 6–23)
CALCIUM: 8.5 mg/dL (ref 8.4–10.5)
CHLORIDE: 103 meq/L (ref 96–112)
CO2: 25 mEq/L (ref 19–32)
CREATININE: 0.8 mg/dL (ref 0.50–1.10)
GFR calc Af Amer: >90 mL/min (ref >90)
GFR calc non Af Amer: >90 mL/min (ref >90)
GLUCOSE: 127 mg/dL — AB (ref 70–99)
Potassium: 4 mEq/L (ref 3.7–5.3)
Sodium: 137 mEq/L (ref 137–147)

## 2014-10-30 MED ORDER — OXYCODONE HCL 10 MG PO TABS: 10.0000 mg | ORAL_TABLET | ORAL | Status: DC | PRN

## 2014-10-30 NOTE — Plan of Care (Signed)
Problem: Phase I Progression Outcomes Goal: Dangle/OOB as tolerated per MD order Outcome: Completed/Met Date Met:  10/30/14

## 2014-10-30 NOTE — Discharge Summary (Signed)
Physician Discharge Summary  Patient ID: Anna Schroeder MRN: [id number] DOB/AGE: [birth year] 33 y.o.  Admit date: 10/29/2014 Discharge date: 10/30/2014  Admission Diagnoses: Adnexal mass, uterine fibroids, abnormal uterine bleeding, chronic pelvic pain Discharge Diagnoses:  Active Problems:   Endometriosis determined by laparoscopy   Discharged Condition: good  Hospital Course: On 10/29/2014, the patient underwent the following: Procedure(s): ROBOTIC ASSISTED TOTAL HYSTERECTOMY, LEFT SALPINGOOOPHERECTOMY, RIGHT SALPINGECTOMY.   The postoperative course was uneventful.  She was discharged to home on postoperative day 1 tolerating a regular diet.  Consults: None  Significant Diagnostic Studies: None  Treatments: surgery: see above  Discharge Exam: Blood pressure 123/66, pulse 58, temperature 98.3 F (36.8 C), temperature source Oral, resp. rate 16, height 5\' 2"  (1.575 m), weight 63.05 kg (139 lb), last menstrual period 10/17/2014, SpO2 98 %. General appearance: alert Resp: clear to auscultation bilaterally Cardio: regular rate and rhythm, S1, S2 normal, no murmur, click, rub or gallop GI: soft, non-tender; bowel sounds normal; no masses,  no organomegaly Extremities: extremities normal, atraumatic, no cyanosis or edema Incision/Wound:  C/D/I  Disposition: 01-Home or Self Care  Discharge Instructions    Call MD for:  extreme fatigue    Complete by:  As directed      Call MD for:  persistant dizziness or light-headedness    Complete by:  As directed      Call MD for:  persistant nausea and vomiting    Complete by:  As directed      Call MD for:  redness, tenderness, or signs of infection (pain, swelling, redness, odor or green/yellow discharge around incision site)    Complete by:  As directed      Call MD for:  severe uncontrolled pain    Complete by:  As directed      Call MD for:  temperature >100.4    Complete by:  As directed      Diet general    Complete by:  As  directed      Discharge wound care:    Complete by:  As directed   Keep clean and dry     Driving Restrictions    Complete by:  As directed   No driving while taking narcotics     Increase activity slowly    Complete by:  As directed      Lifting restrictions    Complete by:  As directed   No lifting > 5 lbs for 6 weeks     May shower / Bathe    Complete by:  As directed   No tub baths for 6 weeks     May walk up steps    Complete by:  As directed      Sexual Activity Restrictions    Complete by:  As directed   No intercourse for 8 weeks            Medication List    TAKE these medications        naproxen sodium 220 MG tablet  Commonly known as:  ANAPROX  Take 220 mg by mouth every 6 (six) hours as needed.     Oxycodone HCl 10 MG Tabs  Take 1 tablet (10 mg total) by mouth every 4 (four) hours as needed for severe pain.           Follow-up Information    Follow up with Lahoma Crocker A, MD. Schedule an appointment as soon as possible for a visit in 2 weeks.   Specialty:  Obstetrics and Gynecology   Contact information:   [address] [place] [address] [phone]       Signed: Agnes Lawrence 10/30/2014, 11:06 AM

## 2014-10-30 NOTE — Plan of Care (Signed)
Problem: Phase II Progression Outcomes Goal: Progress activity as tolerated unless otherwise ordered Outcome: Completed/Met Date Met:  10/30/14     

## 2014-10-30 NOTE — Plan of Care (Signed)
Problem: Phase II Progression Outcomes Goal: Pain controlled on oral analgesia Outcome: Completed/Met Date Met:  10/30/14

## 2014-10-30 NOTE — Plan of Care (Signed)
Problem: Phase I Progression Outcomes Goal: Pain controlled with appropriate interventions Outcome: Completed/Met Date Met:  10/30/14

## 2014-10-30 NOTE — Discharge Instructions (Signed)
Robotic Assisted  Hysterectomy °Care After °Refer to this sheet in the next few weeks. These instructions provide you with information on caring for yourself after your procedure. Your caregiver may also give you more specific instructions. Your treatment has been planned according to current medical practices, but problems sometimes occur. Call your caregiver if you have any problems or questions after your procedure. °HOME CARE INSTRUCTIONS °Healing will take time. You may have discomfort, tenderness, swelling, and bruising at the surgical site for a couple of weeks. This is normal and will get better as time goes on. °· Only take over-the-counter or prescription medicines for pain, discomfort, or fever as directed by your caregiver.  °· Do not take aspirin. It can cause bleeding.  °· Do not drive when taking pain medicine.  °· Follow your caregiver's advice regarding diet, exercise, lifting, driving, and general activities.  °· Resume your usual diet as directed and allowed.  °· Get plenty of rest and sleep.  °· Do not douche, use tampons, or have sexual intercourse for at least 6 weeks, or until your caregiver gives you permission.  °· Change your bandages (dressings) as directed by your caregiver.  °· Monitor your temperature and notify your caregiver of a fever.  °· Take showers instead of baths for 2 to 3 weeks.  °· Do not drink alcohol until your caregiver gives you permission.  °· If you develop constipation, you may take a mild laxative with your caregiver's permission. Bran foods may help with constipation problems. Drinking enough fluids to keep your urine clear or pale yellow may help as well.  °· Try to have someone home with you for 1 or 2 weeks to help around the house.  °· Keep all your follow-up appointments as directed by your caregiver.  °SEEK MEDICIAL CARE IF:  °· You have swelling, redness, or increasing pain in the wound.  °· You have pus coming from the wound.  °· You notice a bad smell  coming from the wound or bandage (dressing).  °· You have swelling, redness, or pain from the intravenous (IV) site.  °· Your wound breaks open.  °· You feel dizzy or lightheaded.  °· You have pain or bleeding when you urinate.  °· You have persistent diarrhea.  °· You have persistent nausea and vomiting.  °· You have abnormal vaginal discharge.  °· You have a rash.  °· You have any type of abnormal reaction or develop an allergy to your medicine.  °· You have poor pain control with your prescribed medicine.  °SEEK IMMEDIATE MEDICIAL CARE IF:  °· You have a fever.  °· You have severe abdominal pain.  °· You have chest pain.  °· You have shortness of breath.  °· You faint.  °· You have pain, swelling, or redness of your leg.  °· You have heavy vaginal bleeding with blood clots.  °MAKE SURE YOU: °· Understand these instructions.  °· Will watch your condition.  °· Will get help right away if you are not doing well or get worse.  °Document Released: 11/29/2011 Document Reviewed: 11/26/2011 °[employer]® Patient Information ©2012 [employer]. °

## 2014-11-01 ENCOUNTER — Encounter (HOSPITAL_COMMUNITY): Payer: Self-pay | Admitting: Obstetrics & Gynecology

## 2014-11-08 ENCOUNTER — Telehealth: Payer: Self-pay | Admitting: *Deleted

## 2014-11-08 NOTE — Telephone Encounter (Signed)
Pt call to office stating that she is out of pain medication.  Pt states that she is very uncomfortable at night and is having trouble sleeping.  Pt made aware that request will be sent to Dr Delsa Sale.  Pt advised to try Ibuprofen as needed for pain/discomfort.   Please advise on refill

## 2014-11-10 NOTE — Telephone Encounter (Signed)
Can call in 60 ultram

## 2014-11-11 ENCOUNTER — Ambulatory Visit (INDEPENDENT_AMBULATORY_CARE_PROVIDER_SITE_OTHER): Payer: Medicaid Other | Admitting: Obstetrics & Gynecology

## 2014-11-11 VITALS — BP 129/81 | HR 73 | Temp 98.9°F | Wt 140.0 lb

## 2014-11-11 DIAGNOSIS — G8918 Other acute postprocedural pain: Secondary | ICD-10-CM

## 2014-11-11 MED ORDER — TRAMADOL HCL 50 MG PO TABS: 50.0000 mg | ORAL_TABLET | Freq: Four times a day (QID) | ORAL | Status: DC | PRN

## 2014-11-12 ENCOUNTER — Encounter: Payer: Self-pay | Admitting: Obstetrics & Gynecology

## 2014-11-12 ENCOUNTER — Telehealth: Payer: Self-pay | Admitting: *Deleted

## 2014-11-12 NOTE — Telephone Encounter (Signed)
Pt placed call to office stating that the Tramadol that she was given at last visit is making her sick.  Is there anything else she can take? Return call to pt making her aware that request will be sent to Dr Delsa Sale.     Please advise.

## 2014-11-12 NOTE — Progress Notes (Signed)
Subjective:     ALDEN FEAGAN is a 33 y.o. female who presents to the clinic 2 weeks status post TRH/LSO for abnormal uterine bleeding, adnexal mass, fibroids and pelvic pain. Eating a regular diet without difficulty. Bowel movements are normal. Pain is controlled with current analgesics. Medications being used: ibuprofen (OTC).  The following portions of the patient's history were reviewed and updated as appropriate: allergies, current medications, past family history, past medical history, past social history, past surgical history and problem list.  Review of Systems Pertinent items are noted in HPI.    Objective:    BP 129/81 mmHg  Pulse 73  Temp(Src) 98.9 F (37.2 C)  Wt 63.504 kg (140 lb)  LMP 10/17/2014 General:  alert  Abdomen: soft, bowel sounds active, non-tender  Incision:   healing well, no drainage, no erythema, no hernia, no seroma, no swelling, no dehiscence, incision well approximated       Assessment:    Doing well postoperatively. Operative findings again reviewed. Pathology report discussed.    Plan:   Meds ordered this encounter  Medications  . traMADol (ULTRAM) 50 MG tablet    Sig: Take 1 tablet (50 mg total) by mouth every 6 (six) hours as needed.    Dispense:  60 tablet    Refill:  0    1. Continue any current medications. 2. Wound care discussed. 3. Activity restrictions: pelvic rest 4. Anticipated return to work: 2-3 weeks. 5. Follow up: 1 month .

## 2014-11-16 ENCOUNTER — Telehealth: Payer: Self-pay | Admitting: *Deleted

## 2014-11-16 NOTE — Telephone Encounter (Signed)
Patient is following up on call requesting an alternative medication

## 2014-11-22 NOTE — Telephone Encounter (Signed)
Pt called to office again regarding different medication.  Return call to pt made.  Pt states that she did try to take the Tramadol again and it continued to make her sick.   Pt advised to take 600mg  Ibuprofen every 6-8 hours.  Pt made aware no response to previous request has been sent to nursing staff.  Pt made aware request will be sent to Dr Delsa Sale.  Please advise on medication / pain management.

## 2014-11-29 NOTE — Telephone Encounter (Signed)
Should not need anything  Stronger than Ibuprofen/tylenol.  Can offer appointment

## 2014-11-30 NOTE — Telephone Encounter (Signed)
FYI: Pt called to office regarding pain. Pt states that she has been taking her mothers Vicodin with some relief.  Pt was made aware of recommendations and she has a follow up scheduled on 12-17.  Pt states that she will hold out until that date.

## 2014-12-04 NOTE — Telephone Encounter (Signed)
She should not take anyone else's medication.  I cannot prescribe her any pain medication.  She needs an AXR and a f/u appointment

## 2014-12-06 ENCOUNTER — Telehealth: Payer: Self-pay | Admitting: *Deleted

## 2014-12-06 NOTE — Telephone Encounter (Signed)
Patient called stating that this morning she had dried up blood on her panty liner and was having cramping. Patient states there is also a knot around her incision. Patient notified that she did just have major surgery and that cramping and pain can be normal. Patient notified that she may also have some spotting or a pink discharge that could come from the vaginal cuff where the sutures are and that that is normal as well as long as she is not having actual bleeding. Patient notified just to make sure that her incision is clean and dry and that Dr. Delsa Sale would take a look at her incision and everything at her appointment on Thursday. Patient voiced understanding and denied any other concerns.

## 2014-12-07 NOTE — Telephone Encounter (Signed)
Pt has f/u appt

## 2014-12-09 ENCOUNTER — Ambulatory Visit (INDEPENDENT_AMBULATORY_CARE_PROVIDER_SITE_OTHER): Payer: Medicaid Other | Admitting: Obstetrics & Gynecology

## 2014-12-09 ENCOUNTER — Encounter: Payer: Self-pay | Admitting: Obstetrics & Gynecology

## 2014-12-09 VITALS — BP 105/71 | HR 73 | Temp 98.9°F | Ht 62.0 in | Wt 139.0 lb

## 2014-12-09 DIAGNOSIS — Z09 Encounter for follow-up examination after completed treatment for conditions other than malignant neoplasm: Secondary | ICD-10-CM

## 2014-12-12 NOTE — Progress Notes (Signed)
Subjective:     Anna Schroeder is a 33 y.o. female who presents to the clinic 6 weeks status post robotic-assisted hysterectomy for abnormal uterine bleeding and fibroids. Eating a regular diet without difficulty. Bowel movements are normal. The patient is not having any pain.  The following portions of the patient's history were reviewed and updated as appropriate: allergies, current medications, past family history, past medical history, past social history, past surgical history and problem list.  Review of Systems Pertinent items are noted in HPI.    Objective:    BP 105/71 mmHg  Pulse 73  Temp(Src) 98.9 F (37.2 C)  Ht 5\' 2"  (1.575 m)  Wt 63.05 kg (139 lb)  BMI 25.42 kg/m2  LMP 10/17/2014 General:  alert  Abdomen: soft, bowel sounds active, non-tender  Pelvic:   vaginal cuff with small mucosal separation in the midline; monsel's applied; on bimanual exam--the cuff is intact, NT; no palpable adnexal masses       Assessment:    Doing well postoperatively. Operative findings again reviewed. Pathology report discussed.   Depression Plan:     Continue any current medications. OK to resume intercourse in 1 mth Referral-->Behavioral Health  Follow up: 6 weeks .

## 2014-12-12 NOTE — Patient Instructions (Signed)

## 2014-12-13 ENCOUNTER — Telehealth: Payer: Self-pay | Admitting: *Deleted

## 2014-12-13 NOTE — Telephone Encounter (Signed)
Patient called stating she had just used the restroom and doesn't know what came out.  CB: Patient state she went to the restroom earlier today and when she wiped there was a grayish looking paste. Patient states after that passed she had back spasms, cramping and dizziness. Per Dr. Delsa Sale patient notified that she should be fine but that if she wanted we would bring her in one day this week to have her discharge looked at. Patient states she would like to come in. I will schedule patient for Wednesday at 3:00 PM and will call and notify patient tomorrow morning of appointment.

## 2014-12-15 ENCOUNTER — Ambulatory Visit: Payer: Medicaid Other | Admitting: Obstetrics & Gynecology

## 2014-12-20 ENCOUNTER — Encounter: Payer: Self-pay | Admitting: *Deleted

## 2014-12-21 ENCOUNTER — Encounter: Payer: Self-pay | Admitting: Obstetrics & Gynecology

## 2015-01-11 ENCOUNTER — Emergency Department (HOSPITAL_COMMUNITY)
Admission: EM | Admit: 2015-01-11 | Discharge: 2015-01-11 | Disposition: A | Discharge status: Home / Self Care | Payer: No Typology Code available for payment source | Attending: Emergency Medicine | Admitting: Emergency Medicine

## 2015-01-11 ENCOUNTER — Encounter (HOSPITAL_COMMUNITY): Payer: Self-pay | Admitting: Physical Medicine and Rehabilitation

## 2015-01-11 DIAGNOSIS — S8992XA Unspecified injury of left lower leg, initial encounter: Secondary | ICD-10-CM | POA: Insufficient documentation

## 2015-01-11 DIAGNOSIS — Z720 Tobacco use: Secondary | ICD-10-CM | POA: Diagnosis not present

## 2015-01-11 DIAGNOSIS — S161XXA Strain of muscle, fascia and tendon at neck level, initial encounter: Secondary | ICD-10-CM | POA: Insufficient documentation

## 2015-01-11 DIAGNOSIS — Y998 Other external cause status: Secondary | ICD-10-CM | POA: Insufficient documentation

## 2015-01-11 DIAGNOSIS — Y92410 Unspecified street and highway as the place of occurrence of the external cause: Secondary | ICD-10-CM | POA: Insufficient documentation

## 2015-01-11 DIAGNOSIS — S3992XA Unspecified injury of lower back, initial encounter: Secondary | ICD-10-CM | POA: Insufficient documentation

## 2015-01-11 DIAGNOSIS — Z8742 Personal history of other diseases of the female genital tract: Secondary | ICD-10-CM | POA: Insufficient documentation

## 2015-01-11 DIAGNOSIS — Y9389 Activity, other specified: Secondary | ICD-10-CM | POA: Insufficient documentation

## 2015-01-11 DIAGNOSIS — S199XXA Unspecified injury of neck, initial encounter: Secondary | ICD-10-CM | POA: Diagnosis present

## 2015-01-11 DIAGNOSIS — Z8619 Personal history of other infectious and parasitic diseases: Secondary | ICD-10-CM | POA: Insufficient documentation

## 2015-01-11 MED ORDER — TRAMADOL HCL 50 MG PO TABS
50.0000 mg | ORAL_TABLET | Freq: Once | ORAL | Status: AC
  Administered 2015-01-11: 50 mg via ORAL
  Filled 2015-01-11: qty 1

## 2015-01-11 MED ORDER — CYCLOBENZAPRINE HCL 10 MG PO TABS
10.0000 mg | ORAL_TABLET | Freq: Once | ORAL | Status: AC
  Administered 2015-01-11: 10 mg via ORAL
  Filled 2015-01-11: qty 1

## 2015-01-11 MED ORDER — CYCLOBENZAPRINE HCL 10 MG PO TABS: 10.0000 mg | ORAL_TABLET | Freq: Two times a day (BID) | ORAL | Status: DC | PRN

## 2015-01-11 MED ORDER — TRAMADOL HCL 50 MG PO TABS: 50.0000 mg | ORAL_TABLET | Freq: Four times a day (QID) | ORAL | Status: DC | PRN

## 2015-01-11 NOTE — Discharge Instructions (Signed)
Take tramadol as needed for pain. Take flexeril as needed for muscle spasm. Refer to attached documents for more information.
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
## 2015-01-20 ENCOUNTER — Encounter: Payer: Self-pay | Admitting: Obstetrics

## 2015-01-20 ENCOUNTER — Encounter: Payer: Self-pay | Admitting: *Deleted

## 2015-01-20 ENCOUNTER — Ambulatory Visit (INDEPENDENT_AMBULATORY_CARE_PROVIDER_SITE_OTHER): Payer: Medicaid Other | Admitting: Obstetrics

## 2015-01-20 VITALS — BP 135/92 | HR 78 | Temp 97.0°F | Ht 62.0 in | Wt 144.0 lb

## 2015-01-20 DIAGNOSIS — N939 Abnormal uterine and vaginal bleeding, unspecified: Secondary | ICD-10-CM

## 2015-01-20 DIAGNOSIS — Z09 Encounter for follow-up examination after completed treatment for conditions other than malignant neoplasm: Secondary | ICD-10-CM

## 2015-01-21 ENCOUNTER — Encounter: Payer: Self-pay | Admitting: Obstetrics

## 2015-01-21 NOTE — Progress Notes (Signed)
Patient ID: Anna Schroeder, female   DOB: 08/16/1981, 34 y.o.   MRN: [id number]  Chief Complaint  Patient presents with  . Follow-up    Patient had hysterectomy 10-29-14    HPI Anna Schroeder is a 34 y.o. female.  6 week follow up after hysterectomy.  HPI  Past Medical History  Diagnosis Date  . Chlamydia   . Fibroids   . BV (bacterial vaginosis)   . Vaginal yeast infection   . Family history of anesthesia complication     parents were slow to wake up  . Pelvic mass     Past Surgical History  Procedure Laterality Date  . Tubal ligation    . Robotic assisted total hysterectomy N/A 10/29/2014    Procedure: ROBOTIC ASSISTED TOTAL HYSTERECTOMY, LEFT SALPINGOOOPHERECTOMY, RIGHT SALPINGECTOMY;  Surgeon: Lahoma Crocker, MD;  Location: [facility];  Service: Gynecology;  Laterality: N/A;    Family History  Problem Relation Age of Onset  . Hypertension Mother   . Heart disease Mother   . Arthritis Mother   . Alcohol abuse Father   . Arthritis Father   . Asthma Father   . Diabetes Father   . Drug abuse Father   . Heart disease Father   . Hyperlipidemia Father   . Hypertension Father   . Kidney disease Father   . Stroke Father     Social History History  Substance Use Topics  . Smoking status: Current Some Day Smoker -- 0.50 packs/day    Types: Cigarettes  . Smokeless tobacco: Not on file  . Alcohol Use: 0.0 oz/week    0 Not specified per week     Comment: Occassionally    No Known Allergies  Current Outpatient Prescriptions  Medication Sig Dispense Refill  . cyclobenzaprine (FLEXERIL) 10 MG tablet Take 1 tablet (10 mg total) by mouth 2 (two) times daily as needed for muscle spasms. 20 tablet 0   No current facility-administered medications for this visit.    Review of Systems Review of Systems Constitutional: negative for fatigue and weight loss Respiratory: negative for cough and wheezing Cardiovascular: negative for chest pain, fatigue and  palpitations Gastrointestinal: negative for abdominal pain and change in bowel habits Genitourinary:negative Integument/breast: negative for nipple discharge Musculoskeletal:negative for myalgias Neurological: negative for gait problems and tremors Behavioral/Psych: negative for abusive relationship, depression Endocrine: negative for temperature intolerance     Blood pressure 135/92, pulse 78, temperature 97 F (36.1 C), height 5\' 2"  (1.575 m), weight 144 lb (65.318 kg), last menstrual period 10/17/2014.  Physical Exam Physical Exam General:   alert  Skin:   no rash or abnormalities  Lungs:   clear to auscultation bilaterally  Heart:   regular rate and rhythm, S1, S2 normal, no murmur, click, rub or gallop  Breasts:   normal without suspicious masses, skin or nipple changes or axillary nodes  Abdomen:  normal findings: no organomegaly, soft, non-tender and no hernia  Pelvis:  External genitalia: normal general appearance Urinary system: urethral meatus normal and bladder without fullness, nontender Vaginal: normal without tenderness, induration or masses Cervix: absent Adnexa: no masses or tenderness Uterus: absent  Assessment    S/P Hysterectomy.  Doing well.      Plan  F/U prn   Orders Placed This Encounter  Procedures  . SureSwab, Vaginosis/Vaginitis Plus   No orders of the defined types were placed in this encounter.

## 2015-01-24 LAB — SURESWAB, VAGINOSIS/VAGINITIS PLUS
ATOPOBIUM VAGINAE: NOT DETECTED Log (cells/mL)
C. TROPICALIS, DNA: NOT DETECTED
C. albicans, DNA: NOT DETECTED
C. glabrata, DNA: NOT DETECTED
C. parapsilosis, DNA: NOT DETECTED
C. trachomatis RNA, TMA: NOT DETECTED
Gardnerella vaginalis: NOT DETECTED Log (cells/mL)
LACTOBACILLUS SPECIES: NOT DETECTED Log (cells/mL)
MEGASPHAERA SPECIES: NOT DETECTED Log (cells/mL)
N. gonorrhoeae RNA, TMA: NOT DETECTED
T. vaginalis RNA, QL TMA: NOT DETECTED

## 2015-03-03 ENCOUNTER — Encounter: Payer: Self-pay | Admitting: *Deleted

## 2015-03-03 ENCOUNTER — Ambulatory Visit (INDEPENDENT_AMBULATORY_CARE_PROVIDER_SITE_OTHER): Payer: Medicaid Other | Admitting: Obstetrics

## 2015-03-03 VITALS — BP 115/69 | HR 71 | Temp 98.6°F | Wt 138.0 lb

## 2015-03-03 DIAGNOSIS — N939 Abnormal uterine and vaginal bleeding, unspecified: Secondary | ICD-10-CM

## 2015-03-03 DIAGNOSIS — Z09 Encounter for follow-up examination after completed treatment for conditions other than malignant neoplasm: Secondary | ICD-10-CM

## 2015-03-03 MED ORDER — CITRANATAL HARMONY 27-1-260 MG PO CAPS: 1.0000 | ORAL_CAPSULE | Freq: Every day | ORAL | Status: DC

## 2015-03-04 ENCOUNTER — Encounter: Payer: Self-pay | Admitting: Obstetrics

## 2015-03-04 NOTE — Progress Notes (Signed)
Patient ID: Anna Schroeder, female   DOB: 1981-03-20, 34 y.o.   MRN: [id number]  Chief Complaint  Patient presents with  . Follow-up    HPI Anna Schroeder is a 34 y.o. female.  Presents for 6 week F/U after Robotic assisted TLH.  No complaints. HPI  Past Medical History  Diagnosis Date  . Chlamydia   . Fibroids   . BV (bacterial vaginosis)   . Vaginal yeast infection   . Family history of anesthesia complication     parents were slow to wake up  . Pelvic mass     Past Surgical History  Procedure Laterality Date  . Tubal ligation    . Robotic assisted total hysterectomy N/A 10/29/2014    Procedure: ROBOTIC ASSISTED TOTAL HYSTERECTOMY, LEFT SALPINGOOOPHERECTOMY, RIGHT SALPINGECTOMY;  Surgeon: Lahoma Crocker, MD;  Location: [facility];  Service: Gynecology;  Laterality: N/A;    Family History  Problem Relation Age of Onset  . Hypertension Mother   . Heart disease Mother   . Arthritis Mother   . Alcohol abuse Father   . Arthritis Father   . Asthma Father   . Diabetes Father   . Drug abuse Father   . Heart disease Father   . Hyperlipidemia Father   . Hypertension Father   . Kidney disease Father   . Stroke Father     Social History History  Substance Use Topics  . Smoking status: Current Some Day Smoker -- 0.50 packs/day    Types: Cigarettes  . Smokeless tobacco: Not on file  . Alcohol Use: 0.0 oz/week    0 Standard drinks or equivalent per week     Comment: Occassionally    No Known Allergies  Current Outpatient Prescriptions  Medication Sig Dispense Refill  . cyclobenzaprine (FLEXERIL) 10 MG tablet Take 1 tablet (10 mg total) by mouth 2 (two) times daily as needed for muscle spasms. (Patient not taking: Reported on 03/03/2015) 20 tablet 0  . Prenat-FeFmCb-DSS-FA-DHA w/o A (CITRANATAL HARMONY) 27-1-260 MG CAPS Take 1 capsule by mouth daily before breakfast. 30 capsule 11   No current facility-administered medications for this visit.    Review of Systems Review  of Systems Constitutional: negative for fatigue and weight loss Respiratory: negative for cough and wheezing Cardiovascular: negative for chest pain, fatigue and palpitations Gastrointestinal: negative for abdominal pain and change in bowel habits Genitourinary:negative Integument/breast: negative for nipple discharge Musculoskeletal:negative for myalgias Neurological: negative for gait problems and tremors Behavioral/Psych: negative for abusive relationship, depression Endocrine: negative for temperature intolerance     Blood pressure 115/69, pulse 71, temperature 98.6 F (37 C), weight 138 lb (62.596 kg), last menstrual period 10/17/2014.  Physical Exam Physical Exam General:   alert  Skin:   no rash or abnormalities  Lungs:   clear to auscultation bilaterally  Heart:   regular rate and rhythm, S1, S2 normal, no murmur, click, rub or gallop  Breasts:   normal without suspicious masses, skin or nipple changes or axillary nodes  Abdomen:  normal findings: no organomegaly, soft, non-tender and no hernia  Pelvis:  External genitalia: normal general appearance Urinary system: urethral meatus normal and bladder without fullness, nontender Vaginal: normal without tenderness, induration or masses Cervix: absent Adnexa: normal bimanual exam Uterus: absent      Data Reviewed none  Assessment     S/P Robotic Assisted TLH.  Doing well.    Plan    Routine F/U for annual exam.   Orders Placed This Encounter  Procedures  .  SureSwab, Vaginosis/Vaginitis Plus   Meds ordered this encounter  Medications  . Prenat-FeFmCb-DSS-FA-DHA w/o A (CITRANATAL HARMONY) 27-1-260 MG CAPS    Sig: Take 1 capsule by mouth daily before breakfast.    Dispense:  30 capsule    Refill:  11

## 2015-03-06 ENCOUNTER — Encounter (HOSPITAL_COMMUNITY): Payer: Self-pay

## 2015-03-06 ENCOUNTER — Emergency Department (HOSPITAL_COMMUNITY)
Admission: EM | Admit: 2015-03-06 | Discharge: 2015-03-06 | Disposition: A | Discharge status: Home / Self Care | Payer: Medicaid Other | Attending: Emergency Medicine | Admitting: Emergency Medicine

## 2015-03-06 DIAGNOSIS — Y998 Other external cause status: Secondary | ICD-10-CM | POA: Insufficient documentation

## 2015-03-06 DIAGNOSIS — Z79899 Other long term (current) drug therapy: Secondary | ICD-10-CM | POA: Insufficient documentation

## 2015-03-06 DIAGNOSIS — S61219A Laceration without foreign body of unspecified finger without damage to nail, initial encounter: Secondary | ICD-10-CM

## 2015-03-06 DIAGNOSIS — Z8742 Personal history of other diseases of the female genital tract: Secondary | ICD-10-CM | POA: Insufficient documentation

## 2015-03-06 DIAGNOSIS — Z23 Encounter for immunization: Secondary | ICD-10-CM | POA: Diagnosis not present

## 2015-03-06 DIAGNOSIS — Z8619 Personal history of other infectious and parasitic diseases: Secondary | ICD-10-CM | POA: Insufficient documentation

## 2015-03-06 DIAGNOSIS — Y281XXA Contact with knife, undetermined intent, initial encounter: Secondary | ICD-10-CM | POA: Insufficient documentation

## 2015-03-06 DIAGNOSIS — Y9389 Activity, other specified: Secondary | ICD-10-CM | POA: Insufficient documentation

## 2015-03-06 DIAGNOSIS — Z8541 Personal history of malignant neoplasm of cervix uteri: Secondary | ICD-10-CM | POA: Diagnosis not present

## 2015-03-06 DIAGNOSIS — Y9289 Other specified places as the place of occurrence of the external cause: Secondary | ICD-10-CM | POA: Insufficient documentation

## 2015-03-06 DIAGNOSIS — Z720 Tobacco use: Secondary | ICD-10-CM | POA: Insufficient documentation

## 2015-03-06 DIAGNOSIS — S61212A Laceration without foreign body of right middle finger without damage to nail, initial encounter: Secondary | ICD-10-CM | POA: Diagnosis present

## 2015-03-06 MED ORDER — TETANUS-DIPHTH-ACELL PERTUSSIS 5-2.5-18.5 LF-MCG/0.5 IM SUSP
0.5000 mL | Freq: Once | INTRAMUSCULAR | Status: AC
  Administered 2015-03-06: 0.5 mL via INTRAMUSCULAR
  Filled 2015-03-06: qty 0.5

## 2015-03-06 NOTE — ED Provider Notes (Signed)
CSN: [id number]     Arrival date & time 03/06/15  0092 History  This chart was scribed for a non-physician practitioner, Monico Blitz, PA-C working with Davonna Belling, MD by Martinique Peace, ED Scribe. The patient was seen in [facility]. The patient's care was started at 10:25 AM.      Chief Complaint  Patient presents with  . Extremity Laceration      The history is provided by the patient. No language interpreter was used.    HPI Comments: Anna Schroeder is a 34 y.o. female who presents to the Emergency Department complaining of finger laceration onset last night while pt was cutting onions. Laceration is specifically to the distal aspect of her right middle finger. She states that she placed a knife on the counter and noticed it was about to fall off, so she reached out her right hand in attempt to try and catch it and cut herself. Bleeding is controlled. Pt is right-hand dominant. Pt is current some day smoker.    Past Medical History  Diagnosis Date  . Chlamydia   . Fibroids   . BV (bacterial vaginosis)   . Vaginal yeast infection   . Family history of anesthesia complication     parents were slow to wake up  . Pelvic mass    Past Surgical History  Procedure Laterality Date  . Tubal ligation    . Robotic assisted total hysterectomy N/A 10/29/2014    Procedure: ROBOTIC ASSISTED TOTAL HYSTERECTOMY, LEFT SALPINGOOOPHERECTOMY, RIGHT SALPINGECTOMY;  Surgeon: Lahoma Crocker, MD;  Location: [facility];  Service: Gynecology;  Laterality: N/A;   Family History  Problem Relation Age of Onset  . Hypertension Mother   . Heart disease Mother   . Arthritis Mother   . Alcohol abuse Father   . Arthritis Father   . Asthma Father   . Diabetes Father   . Drug abuse Father   . Heart disease Father   . Hyperlipidemia Father   . Hypertension Father   . Kidney disease Father   . Stroke Father    History  Substance Use Topics  . Smoking status: Current Some Day Smoker -- 0.50  packs/day    Types: Cigarettes  . Smokeless tobacco: Not on file  . Alcohol Use: 0.0 oz/week    0 Standard drinks or equivalent per week     Comment: Occassionally   OB History    Gravida Para Term Preterm AB TAB SAB Ectopic Multiple Living   3 3 3       3      Review of Systems  Skin: Positive for wound.       Finger laceration.    A complete 10 system review of systems was obtained and all systems are negative except as noted in the HPI and PMH.    Allergies  Review of patient's allergies indicates no known allergies.  Home Medications   Prior to Admission medications   Medication Sig Start Date End Date Taking? Authorizing Provider  cyclobenzaprine (FLEXERIL) 10 MG tablet Take 1 tablet (10 mg total) by mouth 2 (two) times daily as needed for muscle spasms. Patient not taking: Reported on 03/03/2015 01/11/15   Alvina Chou, PA-C  Prenat-FeFmCb-DSS-FA-DHA w/o A (CITRANATAL HARMONY) 27-1-260 MG CAPS Take 1 capsule by mouth daily before breakfast. 03/03/15   Shelly Bombard, MD   BP 129/84 mmHg  Pulse 62  Temp(Src) 98 F (36.7 C) (Oral)  Resp 20  Wt 138 lb (62.596 kg)  SpO2 100%  LMP 10/17/2014 Physical Exam  Constitutional: She is oriented to person, place, and time. She appears well-developed and well-nourished. No distress.  HENT:  Head: Normocephalic and atraumatic.  Eyes: Conjunctivae and EOM are normal.  Neck: Neck supple. No tracheal deviation present.  Cardiovascular: Normal rate.   Pulmonary/Chest: Effort normal. No respiratory distress.  Musculoskeletal: Normal range of motion.  Neurological: She is alert and oriented to person, place, and time.  Skin: Skin is warm and dry.  1 cm after full-thickness laceration to the right middle digit. Full range of motion, bleeding controlled  Psychiatric: She has a normal mood and affect. Her behavior is normal.  Nursing note and vitals reviewed.   ED Course  Procedures (including critical care time) Labs  Review Labs Reviewed - No data to display  Imaging Review No results found.   EKG Interpretation None     Medications - No data to display  10:28 AM- Treatment plan was discussed with patient who verbalizes understanding and agrees.   MDM   Final diagnoses:  Finger laceration, initial encounter    Filed Vitals:   03/06/15 0955  BP: 129/84  Pulse: 62  Temp: 98 F (36.7 C)  TempSrc: Oral  Resp: 20  Weight: 138 lb (62.596 kg)  SpO2: 100%    Medications  Tdap (BOOSTRIX) injection 0.5 mL (0.5 mLs Intramuscular Given 03/06/15 1130)    Anna Schroeder is a pleasant 34 y.o. female presenting with small laceration to tip of finger. Tetanus is updated and wound closed with Dermabond.  Evaluation does not show pathology that would require ongoing emergent intervention or inpatient treatment. Pt is hemodynamically stable and mentating appropriately. Discussed findings and plan with patient/guardian, who agrees with care plan. All questions answered. Return precautions discussed and outpatient follow up given.    I personally performed the services described in this documentation, which was scribed in my presence. The recorded information has been reviewed and is accurate.     Monico Blitz, PA-C 03/07/15 [place], MD 03/09/15 [phone]

## 2015-03-06 NOTE — ED Notes (Signed)
Declined W/C at D/C and was escorted to lobby by RN. 

## 2015-03-06 NOTE — Discharge Instructions (Signed)
If you see signs of infection (warmth, redness, tenderness, pus, sharp increase in pain, fever, red streaking) immediately return to the emergency department. ° °Please follow with your primary care doctor in the next 2 days for a check-up. They must obtain records for further management.  ° °Do not hesitate to return to the Emergency Department for any new, worsening or concerning symptoms.  ° ° °

## 2015-03-06 NOTE — ED Notes (Signed)
Pt reports she was cutting onions last night.  The knife started to fall off the counter and she grabbed it.  There is a laceration to R middle finger.  Gauze and tape in place.  Bleeding controlled.

## 2015-03-07 LAB — SURESWAB, VAGINOSIS/VAGINITIS PLUS
Atopobium vaginae: NOT DETECTED Log (cells/mL)
C. ALBICANS, DNA: DETECTED — AB
C. GLABRATA, DNA: NOT DETECTED
C. TROPICALIS, DNA: NOT DETECTED
C. parapsilosis, DNA: NOT DETECTED
C. trachomatis RNA, TMA: NOT DETECTED
LACTOBACILLUS SPECIES: >8 Log (cells/mL)
MEGASPHAERA SPECIES: NOT DETECTED Log (cells/mL)
N. gonorrhoeae RNA, TMA: NOT DETECTED
T. vaginalis RNA, QL TMA: NOT DETECTED

## 2015-03-08 ENCOUNTER — Other Ambulatory Visit: Payer: Self-pay | Admitting: Obstetrics

## 2015-03-08 DIAGNOSIS — B3731 Acute candidiasis of vulva and vagina: Secondary | ICD-10-CM

## 2015-03-08 DIAGNOSIS — B373 Candidiasis of vulva and vagina: Secondary | ICD-10-CM

## 2015-03-08 MED ORDER — FLUCONAZOLE 150 MG PO TABS: 150.0000 mg | ORAL_TABLET | Freq: Once | ORAL | Status: DC

## 2015-04-27 ENCOUNTER — Telehealth: Payer: Self-pay | Admitting: *Deleted

## 2015-04-27 NOTE — Telephone Encounter (Signed)
Patient is calling with back spasms. 12:03 Patient has had a hysterectomy but she still has monthly pms symptoms. Patient has 1 ovary left. Patient complains of back spasms, lower abdominal cramps(not as bad as before hysterectomy), and other monthly symptoms. Patient states she has missed work this week because her symptoms are so bad. Patient wants to know if there is anything that she can do to reduce the discomfort monthly- Ibuprofen and Tylenol are just not helping. Told patient I would speak to provider for advise.

## 2015-04-29 ENCOUNTER — Other Ambulatory Visit: Payer: Self-pay | Admitting: Obstetrics

## 2015-04-29 DIAGNOSIS — M545 Low back pain, unspecified: Secondary | ICD-10-CM

## 2015-04-29 MED ORDER — METHOCARBAMOL 500 MG PO TABS: 500.0000 mg | ORAL_TABLET | Freq: Three times a day (TID) | ORAL | Status: DC | PRN

## 2015-04-29 MED ORDER — CYCLOBENZAPRINE HCL 10 MG PO TABS: 10.0000 mg | ORAL_TABLET | Freq: Three times a day (TID) | ORAL | Status: DC | PRN

## 2015-04-29 NOTE — Telephone Encounter (Signed)
Recommend stopping Ibuprofen / Tylenol. Start Robaxin / Flexeril. Robaxin / Flexeril Rx.

## 2015-05-02 NOTE — Telephone Encounter (Signed)
Patient advised to stop taking the ibuprofen and tylenol and to start the Flexeril and Robaxin. Patient verbalized understanding

## 2015-08-03 ENCOUNTER — Emergency Department (HOSPITAL_COMMUNITY)
Admission: EM | Admit: 2015-08-03 | Discharge: 2015-08-03 | Disposition: A | Discharge status: Home / Self Care | Payer: Medicaid Other | Attending: Emergency Medicine | Admitting: Emergency Medicine

## 2015-08-03 ENCOUNTER — Emergency Department (HOSPITAL_COMMUNITY): Payer: Medicaid Other

## 2015-08-03 ENCOUNTER — Encounter (HOSPITAL_COMMUNITY): Payer: Self-pay | Admitting: Family Medicine

## 2015-08-03 DIAGNOSIS — Z720 Tobacco use: Secondary | ICD-10-CM | POA: Diagnosis not present

## 2015-08-03 DIAGNOSIS — R200 Anesthesia of skin: Secondary | ICD-10-CM | POA: Insufficient documentation

## 2015-08-03 DIAGNOSIS — Z8619 Personal history of other infectious and parasitic diseases: Secondary | ICD-10-CM | POA: Insufficient documentation

## 2015-08-03 DIAGNOSIS — Z86018 Personal history of other benign neoplasm: Secondary | ICD-10-CM | POA: Insufficient documentation

## 2015-08-03 DIAGNOSIS — R079 Chest pain, unspecified: Secondary | ICD-10-CM | POA: Diagnosis not present

## 2015-08-03 DIAGNOSIS — R0602 Shortness of breath: Secondary | ICD-10-CM | POA: Diagnosis present

## 2015-08-03 LAB — CBC
HCT: 38.6 % (ref 36.0–46.0)
HEMOGLOBIN: 14 g/dL (ref 12.0–15.0)
MCH: 31.4 pg (ref 26.0–34.0)
MCHC: 36.3 g/dL — AB (ref 30.0–36.0)
MCV: 86.5 fL (ref 78.0–100.0)
Platelets: 199 10*3/uL (ref 150–400)
RBC: 4.46 MIL/uL (ref 3.87–5.11)
RDW: 12.9 % (ref 11.5–15.5)
WBC: 9.3 10*3/uL (ref 4.0–10.5)

## 2015-08-03 LAB — BASIC METABOLIC PANEL
Anion gap: 7 (ref 5–15)
BUN: 11 mg/dL (ref 6–20)
CALCIUM: 9.4 mg/dL (ref 8.9–10.3)
CO2: 24 mmol/L (ref 22–32)
CREATININE: 0.89 mg/dL (ref 0.44–1.00)
Chloride: 105 mmol/L (ref 101–111)
GFR calc non Af Amer: >60 mL/min (ref >60)
Glucose, Bld: 96 mg/dL (ref 65–99)
Potassium: 3.8 mmol/L (ref 3.5–5.1)
SODIUM: 136 mmol/L (ref 135–145)

## 2015-08-03 LAB — I-STAT TROPONIN, ED: TROPONIN I, POC: 0 ng/mL (ref 0.00–0.08)

## 2015-08-03 NOTE — ED Provider Notes (Signed)
CSN: [id number]     Arrival date & time 08/03/15  1212 History   First MD Initiated Contact with Patient 08/03/15 1228     Chief Complaint  Patient presents with  . Shortness of Breath  . Numbness     (Consider location/radiation/quality/duration/timing/severity/associated sxs/prior Treatment) Patient is a 34 y.o. female presenting with shortness of breath. The history is provided by the patient.  Shortness of Breath Associated symptoms: no abdominal pain, no chest pain, no headaches, no rash and no vomiting   patient presented with shortness of breath and chest pain. States she was filling out paperwork for her daughter for school she began to feel her heart pounding and had pain in his upper chest. With some radiation to her left arm. She said she felt lightheaded and had some weakness in both her legs. She feels much better now. She does smoke. No history of early cardiac disease in the family. No history of hypertension or high cholesterol. No fevers or chills. She is otherwise been doing well the last few days.  Past Medical History  Diagnosis Date  . Chlamydia   . Fibroids   . BV (bacterial vaginosis)   . Vaginal yeast infection   . Family history of anesthesia complication     parents were slow to wake up  . Pelvic mass    Past Surgical History  Procedure Laterality Date  . Tubal ligation    . Robotic assisted total hysterectomy N/A 10/29/2014    Procedure: ROBOTIC ASSISTED TOTAL HYSTERECTOMY, LEFT SALPINGOOOPHERECTOMY, RIGHT SALPINGECTOMY;  Surgeon: Lahoma Crocker, MD;  Location: [facility];  Service: Gynecology;  Laterality: N/A;   Family History  Problem Relation Age of Onset  . Hypertension Mother   . Heart disease Mother   . Arthritis Mother   . Alcohol abuse Father   . Arthritis Father   . Asthma Father   . Diabetes Father   . Drug abuse Father   . Heart disease Father   . Hyperlipidemia Father   . Hypertension Father   . Kidney disease Father   . Stroke  Father    Social History  Substance Use Topics  . Smoking status: Current Some Day Smoker -- 0.50 packs/day    Types: Cigarettes  . Smokeless tobacco: None  . Alcohol Use: 0.0 oz/week    0 Standard drinks or equivalent per week     Comment: Occassionally   OB History    Gravida Para Term Preterm AB TAB SAB Ectopic Multiple Living   3 3 3       3      Review of Systems  Constitutional: Negative for activity change and appetite change.  Eyes: Negative for pain.  Respiratory: Positive for shortness of breath. Negative for chest tightness.   Cardiovascular: Positive for palpitations. Negative for chest pain and leg swelling.  Gastrointestinal: Negative for nausea, vomiting, abdominal pain and diarrhea.  Genitourinary: Negative for flank pain.  Musculoskeletal: Negative for back pain and neck stiffness.  Skin: Negative for rash.  Neurological: Positive for weakness. Negative for numbness and headaches.  Psychiatric/Behavioral: Negative for behavioral problems.      Allergies  Review of patient's allergies indicates no known allergies.  Home Medications   Prior to Admission medications   Medication Sig Start Date End Date Taking? Authorizing Provider  cyclobenzaprine (FLEXERIL) 10 MG tablet Take 1 tablet (10 mg total) by mouth every 8 (eight) hours as needed for muscle spasms. 04/29/15  Yes Shelly Bombard, MD  fluconazole (DIFLUCAN)  150 MG tablet Take 1 tablet (150 mg total) by mouth once. Patient not taking: Reported on 08/03/2015 03/08/15   Shelly Bombard, MD  methocarbamol (ROBAXIN) 500 MG tablet Take 1 tablet (500 mg total) by mouth every 8 (eight) hours as needed for muscle spasms. Patient not taking: Reported on 08/03/2015 04/29/15   Shelly Bombard, MD  Prenat-FeFmCb-DSS-FA-DHA w/o A (CITRANATAL HARMONY) 27-1-260 MG CAPS Take 1 capsule by mouth daily before breakfast. Patient not taking: Reported on 08/03/2015 03/03/15   Shelly Bombard, MD   BP 133/82 mmHg  Pulse 66   Temp(Src) 98.1 F (36.7 C) (Oral)  Resp 17  SpO2 100%  LMP 10/17/2014 Physical Exam  Constitutional: She is oriented to person, place, and time. She appears well-developed and well-nourished.  HENT:  Head: Normocephalic and atraumatic.  Eyes: Pupils are equal, round, and reactive to light.  Neck: Normal range of motion. No JVD present.  Cardiovascular: Normal rate, regular rhythm and normal heart sounds.   No murmur heard. Pulmonary/Chest: Effort normal and breath sounds normal. No respiratory distress. She has no wheezes. She has no rales.  Abdominal: Soft. Bowel sounds are normal. She exhibits no distension.  Musculoskeletal: Normal range of motion.  Neurological: She is alert and oriented to person, place, and time. No cranial nerve deficit.  Skin: Skin is warm and dry.  Psychiatric: She has a normal mood and affect. Her speech is normal.  Nursing note and vitals reviewed.   ED Course  Procedures (including critical care time) Labs Review Labs Reviewed  CBC - Abnormal; Notable for the following:    MCHC 36.3 (*)    All other components within normal limits  BASIC METABOLIC PANEL  I-STAT TROPOININ, ED    Imaging Review Dg Chest 2 View  08/03/2015   CLINICAL DATA:  Chest and LEFT arm pain with shortness of breath beginning this morning, history smoking  EXAM: CHEST  2 VIEW  COMPARISON:  08/22/2014  FINDINGS: Normal heart size, mediastinal contours, and pulmonary vascularity.  Lungs clear.  No pulmonary infiltrate, pleural effusion or pneumothorax.  Bones unremarkable.  IMPRESSION: No acute abnormalities.   Electronically Signed   By: Lavonia Dana M.D.   On: 08/03/2015 13:14     EKG Interpretation   Date/Time:  Wednesday August 03 2015 12:28:57 EDT Ventricular Rate:  93 PR Interval:  126 QRS Duration: 84 QT Interval:  350 QTC Calculation: 435 R Axis:   85 Text Interpretation:  Normal sinus rhythm Right atrial enlargement Cannot  rule out Anterior infarct , age  undetermined Abnormal ECG Confirmed by  Alvino Chapel  MD, Ovid Curd [phone]) on 08/03/2015 12:36:12 PM      MDM   Final diagnoses:  Chest pain, unspecified chest pain type    Patient with chest pain shortness of breath and numbness. EKG reassuring. Patient had another episode while in the ER showed a similar EKG and heart rate has the presumed baseline here. Nonfocal exam. Will discharge home to follow-up as needed. Could possibly need Holter monitor.    Davonna Belling, MD 08/03/15 [phone]

## 2015-08-03 NOTE — ED Notes (Signed)
Pt family reports pt dizzy and feeling "flushed", repeat EKG done and given to Dr. Alvino Chapel, nad noted. Denies numbness or weakness.

## 2015-08-03 NOTE — Discharge Instructions (Signed)
Follow-up through Dr. For further monitoring.  Chest Pain (Nonspecific) It is often hard to give a specific diagnosis for the cause of chest pain. There is always a chance that your pain could be related to something serious, such as a heart attack or a blood clot in the lungs. You need to follow up with your health care provider for further evaluation. CAUSES   Heartburn.  Pneumonia or bronchitis.  Anxiety or stress.  Inflammation around your heart (pericarditis) or lung (pleuritis or pleurisy).  A blood clot in the lung.  A collapsed lung (pneumothorax). It can develop suddenly on its own (spontaneous pneumothorax) or from trauma to the chest.  Shingles infection (herpes zoster virus). The chest wall is composed of bones, muscles, and cartilage. Any of these can be the source of the pain.  The bones can be bruised by injury.  The muscles or cartilage can be strained by coughing or overwork.  The cartilage can be affected by inflammation and become sore (costochondritis). DIAGNOSIS  Lab tests or other studies may be needed to find the cause of your pain. Your health care provider may have you take a test called an ambulatory electrocardiogram (ECG). An ECG records your heartbeat patterns over a 24-hour period. You may also have other tests, such as:  Transthoracic echocardiogram (TTE). During echocardiography, sound waves are used to evaluate how blood flows through your heart.  Transesophageal echocardiogram (TEE).  Cardiac monitoring. This allows your health care provider to monitor your heart rate and rhythm in real time.  Holter monitor. This is a portable device that records your heartbeat and can help diagnose heart arrhythmias. It allows your health care provider to track your heart activity for several days, if needed.  Stress tests by exercise or by giving medicine that makes the heart beat faster. TREATMENT   Treatment depends on what may be causing your chest pain.  Treatment may include:  Acid blockers for heartburn.  Anti-inflammatory medicine.  Pain medicine for inflammatory conditions.  Antibiotics if an infection is present.  You may be advised to change lifestyle habits. This includes stopping smoking and avoiding alcohol, caffeine, and chocolate.  You may be advised to keep your head raised (elevated) when sleeping. This reduces the chance of acid going backward from your stomach into your esophagus. Most of the time, nonspecific chest pain will improve within 2-3 days with rest and mild pain medicine.  HOME CARE INSTRUCTIONS   If antibiotics were prescribed, take them as directed. Finish them even if you start to feel better.  For the next few days, avoid physical activities that bring on chest pain. Continue physical activities as directed.  Do not use any tobacco products, including cigarettes, chewing tobacco, or electronic cigarettes.  Avoid drinking alcohol.  Only take medicine as directed by your health care provider.  Follow your health care provider's suggestions for further testing if your chest pain does not go away.  Keep any follow-up appointments you made. If you do not go to an appointment, you could develop lasting (chronic) problems with pain. If there is any problem keeping an appointment, call to reschedule. SEEK MEDICAL CARE IF:   Your chest pain does not go away, even after treatment.  You have a rash with blisters on your chest.  You have a fever. SEEK IMMEDIATE MEDICAL CARE IF:   You have increased chest pain or pain that spreads to your arm, neck, jaw, back, or abdomen.  You have shortness of breath.  You  have an increasing cough, or you cough up blood.  You have severe back or abdominal pain.  You feel nauseous or vomit.  You have severe weakness.  You faint.  You have chills. This is an emergency. Do not wait to see if the pain will go away. Get medical help at once. Call your local emergency  services (911 in [place] Do not drive yourself to the hospital. MAKE SURE YOU:   Understand these instructions.  Will watch your condition.  Will get help right away if you are not doing well or get worse. Document Released: 09/19/2005 Document Revised: 12/15/2013 Document Reviewed: 07/15/2008 [facility] Patient Information 2015 [place], [place]. This information is not intended to replace advice given to you by your health care provider. Make sure you discuss any questions you have with your health care provider.

## 2015-08-03 NOTE — ED Notes (Signed)
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 Denies pain currently sts just a lot of tingling in her feet and dizziness,

## 2015-08-03 NOTE — ED Notes (Signed)
Pt states she was at school with her children when she began to have sharp sudden onset of cp, and sob, pt also reports tingling and numbness in feet and toes, nad noted at this time. Pt ambulatory from wheelchair to bed. Pt able to speak in complete sentences.

## 2015-09-23 ENCOUNTER — Encounter (HOSPITAL_COMMUNITY): Payer: Self-pay | Admitting: Emergency Medicine

## 2015-09-23 ENCOUNTER — Telehealth (HOSPITAL_COMMUNITY): Payer: Self-pay | Admitting: Emergency Medicine

## 2015-09-23 ENCOUNTER — Emergency Department (INDEPENDENT_AMBULATORY_CARE_PROVIDER_SITE_OTHER)
Admission: EM | Admit: 2015-09-23 | Discharge: 2015-09-23 | Disposition: A | Discharge status: Home / Self Care | Payer: Medicaid Other | Source: Home / Self Care | Attending: Family Medicine | Admitting: Family Medicine

## 2015-09-23 DIAGNOSIS — K047 Periapical abscess without sinus: Secondary | ICD-10-CM | POA: Diagnosis not present

## 2015-09-23 MED ORDER — DICLOFENAC POTASSIUM 50 MG PO TABS: 50.0000 mg | ORAL_TABLET | Freq: Three times a day (TID) | ORAL | Status: DC

## 2015-09-23 MED ORDER — CLINDAMYCIN HCL 150 MG PO CAPS: 150.0000 mg | ORAL_CAPSULE | Freq: Three times a day (TID) | ORAL | Status: DC

## 2015-09-23 NOTE — ED Provider Notes (Signed)
CSN: [id number]     Arrival date & time 09/23/15  1303 History   First MD Initiated Contact with Patient 09/23/15 1328     Chief Complaint  Patient presents with  . Dental Pain   (Consider location/radiation/quality/duration/timing/severity/associated sxs/prior Treatment) Patient is a 34 y.o. female presenting with tooth pain. The history is provided by the patient.  Dental Pain Location:  Lower Lower teeth location:  31/RL 2nd molar and 32/RL 3rd molar Quality:  Throbbing Onset quality:  Gradual Duration:  3 days Progression:  Worsening Chronicity:  New Context: abscess, dental caries and poor dentition   Relieved by:  Acetaminophen and NSAIDs Associated symptoms: no difficulty swallowing, no facial swelling and no fever   Risk factors: lack of dental care and smoking     Past Medical History  Diagnosis Date  . Chlamydia   . Fibroids   . BV (bacterial vaginosis)   . Vaginal yeast infection   . Family history of anesthesia complication     parents were slow to wake up  . Pelvic mass    Past Surgical History  Procedure Laterality Date  . Tubal ligation    . Robotic assisted total hysterectomy N/A 10/29/2014    Procedure: ROBOTIC ASSISTED TOTAL HYSTERECTOMY, LEFT SALPINGOOOPHERECTOMY, RIGHT SALPINGECTOMY;  Surgeon: Lahoma Crocker, MD;  Location: [facility];  Service: Gynecology;  Laterality: N/A;   Family History  Problem Relation Age of Onset  . Hypertension Mother   . Heart disease Mother   . Arthritis Mother   . Alcohol abuse Father   . Arthritis Father   . Asthma Father   . Diabetes Father   . Drug abuse Father   . Heart disease Father   . Hyperlipidemia Father   . Hypertension Father   . Kidney disease Father   . Stroke Father    Social History  Substance Use Topics  . Smoking status: Current Some Day Smoker -- 0.50 packs/day    Types: Cigarettes  . Smokeless tobacco: None  . Alcohol Use: 0.0 oz/week    0 Standard drinks or equivalent per week   Comment: Occassionally   OB History    Gravida Para Term Preterm AB TAB SAB Ectopic Multiple Living   3 3 3       3      Review of Systems  Constitutional: Negative for fever.  HENT: Positive for dental problem. Negative for facial swelling.   All other systems reviewed and are negative.   Allergies  Review of patient's allergies indicates no known allergies.  Home Medications   Prior to Admission medications   Medication Sig Start Date End Date Taking? Authorizing Provider  clindamycin (CLEOCIN) 150 MG capsule Take 1 capsule (150 mg total) by mouth 3 (three) times daily. 09/23/15   Billy Fischer, MD  cyclobenzaprine (FLEXERIL) 10 MG tablet Take 1 tablet (10 mg total) by mouth every 8 (eight) hours as needed for muscle spasms. 04/29/15   Shelly Bombard, MD  diclofenac (CATAFLAM) 50 MG tablet Take 1 tablet (50 mg total) by mouth 3 (three) times daily. For tooth pain 09/23/15   Billy Fischer, MD  fluconazole (DIFLUCAN) 150 MG tablet Take 1 tablet (150 mg total) by mouth once. Patient not taking: Reported on 08/03/2015 03/08/15   Shelly Bombard, MD  methocarbamol (ROBAXIN) 500 MG tablet Take 1 tablet (500 mg total) by mouth every 8 (eight) hours as needed for muscle spasms. Patient not taking: Reported on 08/03/2015 04/29/15   Shelly Bombard,  MD  Prenat-FeFmCb-DSS-FA-DHA w/o A (CITRANATAL HARMONY) 27-1-260 MG CAPS Take 1 capsule by mouth daily before breakfast. Patient not taking: Reported on 08/03/2015 03/03/15   Shelly Bombard, MD   Meds Ordered and Administered this Visit  Medications - No data to display  BP 123/83 mmHg  Pulse 67  Temp(Src) 98.5 F (36.9 C) (Oral)  Resp 14  SpO2 100%  LMP 10/17/2014 No data found.   Physical Exam  Constitutional: She is oriented to person, place, and time. She appears well-developed and well-nourished. She appears distressed.  HENT:  Mouth/Throat: Oropharynx is clear and moist. Dental abscesses present.    Neurological: She is alert and  oriented to person, place, and time.  Skin: Skin is warm and dry.  Nursing note and vitals reviewed.   ED Course  Procedures (including critical care time)  Labs Review Labs Reviewed - No data to display  Imaging Review No results found.   Visual Acuity Review  Right Eye Distance:   Left Eye Distance:   Bilateral Distance:    Right Eye Near:   Left Eye Near:    Bilateral Near:         MDM   1. Abscess, dental       Billy Fischer, MD 09/23/15 1350

## 2015-09-23 NOTE — ED Notes (Signed)
Bottom, right toothache.  Patient reports pain for 3 days. Patient reports tooth pain is giving her a "migraine"

## 2015-10-11 ENCOUNTER — Encounter (HOSPITAL_COMMUNITY): Payer: Self-pay | Admitting: Family Medicine

## 2015-10-11 ENCOUNTER — Emergency Department (HOSPITAL_COMMUNITY)
Admission: EM | Admit: 2015-10-11 | Discharge: 2015-10-11 | Disposition: A | Discharge status: Home / Self Care | Payer: Medicaid Other | Attending: Emergency Medicine | Admitting: Emergency Medicine

## 2015-10-11 ENCOUNTER — Other Ambulatory Visit: Payer: Self-pay | Admitting: Obstetrics

## 2015-10-11 DIAGNOSIS — Z720 Tobacco use: Secondary | ICD-10-CM | POA: Diagnosis not present

## 2015-10-11 DIAGNOSIS — K029 Dental caries, unspecified: Secondary | ICD-10-CM | POA: Insufficient documentation

## 2015-10-11 DIAGNOSIS — R51 Headache: Secondary | ICD-10-CM | POA: Insufficient documentation

## 2015-10-11 DIAGNOSIS — R42 Dizziness and giddiness: Secondary | ICD-10-CM | POA: Diagnosis not present

## 2015-10-11 DIAGNOSIS — Z791 Long term (current) use of non-steroidal anti-inflammatories (NSAID): Secondary | ICD-10-CM | POA: Diagnosis not present

## 2015-10-11 DIAGNOSIS — Z8619 Personal history of other infectious and parasitic diseases: Secondary | ICD-10-CM | POA: Insufficient documentation

## 2015-10-11 DIAGNOSIS — Z86018 Personal history of other benign neoplasm: Secondary | ICD-10-CM | POA: Insufficient documentation

## 2015-10-11 DIAGNOSIS — Z8742 Personal history of other diseases of the female genital tract: Secondary | ICD-10-CM | POA: Diagnosis not present

## 2015-10-11 DIAGNOSIS — Z79899 Other long term (current) drug therapy: Secondary | ICD-10-CM | POA: Diagnosis not present

## 2015-10-11 DIAGNOSIS — R519 Headache, unspecified: Secondary | ICD-10-CM

## 2015-10-11 MED ORDER — BUPIVACAINE HCL (PF) 0.25 % IJ SOLN
10.0000 mL | Freq: Once | INTRAMUSCULAR | Status: DC
  Filled 2015-10-11: qty 10

## 2015-10-11 MED ORDER — IBUPROFEN 800 MG PO TABS
800.0000 mg | ORAL_TABLET | Freq: Once | ORAL | Status: AC
  Administered 2015-10-11: 800 mg via ORAL
  Filled 2015-10-11: qty 1

## 2015-10-11 MED ORDER — PENICILLIN V POTASSIUM 500 MG PO TABS: 500.0000 mg | ORAL_TABLET | Freq: Four times a day (QID) | ORAL | Status: AC

## 2015-10-11 MED ORDER — ACETAMINOPHEN 500 MG PO TABS
1000.0000 mg | ORAL_TABLET | Freq: Once | ORAL | Status: AC
  Administered 2015-10-11: 1000 mg via ORAL
  Filled 2015-10-11: qty 2

## 2015-10-11 MED ORDER — BUPIVACAINE HCL 0.25 % IJ SOLN: 10.0000 mL | Freq: Once | INTRAMUSCULAR | Status: DC

## 2015-10-11 NOTE — ED Notes (Addendum)
Pt presents from home with c/o headache and lightheadedness since Sunday.  She reports was having headaches/tooth pain in September, was given antibiotics from [facility] and those headaches resolved.  This past Sunday she began having headaches again accompanied by lightheadedness -- states this headache feels different from the headaches in Sept. She reports trying Aleve with no relief.

## 2015-10-11 NOTE — Discharge Instructions (Signed)
Dental list          updated 1.22.15 These dentists all accept Medicaid.  The list is for your convenience in choosing your childs dentist. Estos dentistas aceptan Medicaid.  La lista es para su [place] y es una cortesa.     [facility]     [phone] [place] [address] Se habla espaol From 97 to 35 years old Parent may go with child Anette Riedel DDS     [phone] [address] [place]  [address] Se habla espaol From 63 to 43 years old Parent may NOT go with child  Rolene Arbour DMD    [phone] [place] [address] Se habla espaol [place] spoken From 58 years old Parent may go with child [facility]     [phone] [address] [place] [address] Se habla espaol From 55 to 63 years old Parent may NOT go with child  Marcelo Baldy DDS     [phone] [facility]      [address]  [place] [address] No se habla espaol From teeth coming in Parent may go with child  [facility]     [phone] [address] [place] [address] Requires certification. Call for information. Requiere certificacin. Llame para informacin. Algunos dias se habla espaol  From birth to 4 years Parent possibly goes with child  Kandice Hams DDS     [place]  [address] [place] [address] Se habla espaol From 18 months to 18 years  Parent may go with child  J. Trenton DDS    [place] [facility] [address] [place] [address] Se habla espaol From 61 year old Parent may go with child  Shelton Silvas DDS    [phone] [place] [address] Se habla espaol  From 55 months old Parent may go with child Ivory Broad DDS    [phone] [address] [place] [address] Se habla espaol From 67 to 79 years old Parent may go with child  [facility]    [phone] [address] [place] [address] No se habla espaol From birth Parent may not go with child      Dental Pain Dental pain may be caused by many things, including:  Tooth decay (cavities or caries). Cavities cause the nerve of your tooth to be open to air and hot or cold temperatures. This can cause pain or discomfort.  Abscess or infection. A dental abscess is an area that is full of infected pus from a bacterial infection in the inner part of the tooth (pulp). It usually happens at the end of the tooth's root.  Injury.  An unknown reason (idiopathic). Your pain may be mild or severe. It may only happen when:  You are chewing.  You are exposed to hot or cold temperature.  You are eating or drinking sugary foods or beverages, such as:  Soda.  Candy. Your pain may also be there all of the time. HOME CARE Watch your dental pain for any changes. Do these things to lessen your discomfort:  Take medicines only as told by your dentist.  If your dentist tells you to take an antibiotic medicine, finish all of it even if you start to feel better.  Keep all follow-up visits as told by your dentist. This is important.  Do  not apply heat to the outside of your face.  Rinse your mouth or gargle with salt water if told by your dentist. This helps with pain and swelling.  You can make salt water by adding  tsp of salt to 1 cup of warm water.  Apply ice to the painful area of your face:  Put ice in a plastic bag.  Place a towel between your skin and the bag.  Leave the ice on for 20 minutes, 2-3 times per day.  Avoid foods or drinks that cause you pain, such as:  Very hot or very cold foods or drinks.  Sweet or sugary foods or drinks. GET HELP IF:  Your pain is not helped with medicines.  Your symptoms are worse.  You have new symptoms. GET HELP RIGHT AWAY IF:  You cannot open your mouth.  You are having trouble breathing or  swallowing.  You have a fever.  Your face, neck, or jaw is puffy (swollen).   This information is not intended to replace advice given to you by your health care provider. Make sure you discuss any questions you have with your health care provider.   Document Released: 05/28/2008 Document Revised: 04/26/2015 Document Reviewed: 12/06/2014 Elsevier Interactive Patient Education [employer].

## 2015-10-11 NOTE — ED Provider Notes (Signed)
CSN: [id number]     Arrival date & time 10/11/15  2505 History   First MD Initiated Contact with Patient 10/11/15 [phone]     Chief Complaint  Patient presents with  . Dizziness  . Headache     (Consider location/radiation/quality/duration/timing/severity/associated sxs/prior Treatment) Patient is a 34 y.o. female presenting with general illness. The history is provided by the patient.  Illness Severity:  Moderate Onset quality:  Gradual Duration:  2 days Timing:  Constant Progression:  Worsening Chronicity:  Recurrent Associated symptoms: congestion and cough   Associated symptoms: no chest pain, no fever, no headaches, no myalgias, no nausea, no rhinorrhea, no shortness of breath, no vomiting and no wheezing   Associated symptoms comment:  Dental pain   34 yo F  With a chief complaint of a headache. This started a couple days ago. Slow in onset denies fevers or chills. Having some congestion and cough.   Throbbing pain worse in the right side radiates up to her ear. Was seen about a month ago with right lower dental pain. Patient has not followed up with a dentist.  Past Medical History  Diagnosis Date  . Chlamydia   . Fibroids   . BV (bacterial vaginosis)   . Vaginal yeast infection   . Family history of anesthesia complication     parents were slow to wake up  . Pelvic mass    Past Surgical History  Procedure Laterality Date  . Tubal ligation    . Robotic assisted total hysterectomy N/A 10/29/2014    Procedure: ROBOTIC ASSISTED TOTAL HYSTERECTOMY, LEFT SALPINGOOOPHERECTOMY, RIGHT SALPINGECTOMY;  Surgeon: Lahoma Crocker, MD;  Location: [facility];  Service: Gynecology;  Laterality: N/A;   Family History  Problem Relation Age of Onset  . Hypertension Mother   . Heart disease Mother   . Arthritis Mother   . Alcohol abuse Father   . Arthritis Father   . Asthma Father   . Diabetes Father   . Drug abuse Father   . Heart disease Father   . Hyperlipidemia Father   .  Hypertension Father   . Kidney disease Father   . Stroke Father    Social History  Substance Use Topics  . Smoking status: Current Some Day Smoker -- 0.50 packs/day    Types: Cigarettes  . Smokeless tobacco: None  . Alcohol Use: 0.0 oz/week    0 Standard drinks or equivalent per week     Comment: Occassionally   OB History    Gravida Para Term Preterm AB TAB SAB Ectopic Multiple Living   3 3 3       3      Review of Systems  Constitutional: Negative for fever and chills.  HENT: Positive for congestion and dental problem. Negative for rhinorrhea.   Eyes: Negative for redness and visual disturbance.  Respiratory: Positive for cough. Negative for shortness of breath and wheezing.   Cardiovascular: Negative for chest pain and palpitations.  Gastrointestinal: Negative for nausea and vomiting.  Genitourinary: Negative for dysuria and urgency.  Musculoskeletal: Negative for myalgias and arthralgias.  Skin: Negative for pallor and wound.  Neurological: Negative for dizziness and headaches.      Allergies  Review of patient's allergies indicates no known allergies.  Home Medications   Prior to Admission medications   Medication Sig Start Date End Date Taking? Authorizing Provider  clindamycin (CLEOCIN) 150 MG capsule Take 1 capsule (150 mg total) by mouth 3 (three) times daily. 09/23/15   Billy Fischer, MD  cyclobenzaprine (FLEXERIL) 10 MG tablet Take 1 tablet (10 mg total) by mouth every 8 (eight) hours as needed for muscle spasms. 04/29/15   Shelly Bombard, MD  diclofenac (CATAFLAM) 50 MG tablet Take 1 tablet (50 mg total) by mouth 3 (three) times daily. For tooth pain 09/23/15   Billy Fischer, MD  fluconazole (DIFLUCAN) 150 MG tablet Take 1 tablet (150 mg total) by mouth once. Patient not taking: Reported on 08/03/2015 03/08/15   Shelly Bombard, MD  methocarbamol (ROBAXIN) 500 MG tablet Take 1 tablet (500 mg total) by mouth every 8 (eight) hours as needed for muscle  spasms. Patient not taking: Reported on 08/03/2015 04/29/15   Shelly Bombard, MD  penicillin v potassium (VEETID) 500 MG tablet Take 1 tablet (500 mg total) by mouth 4 (four) times daily. 10/11/15 10/18/15  Deno Etienne, DO  Prenat-FeFmCb-DSS-FA-DHA w/o A (CITRANATAL HARMONY) 27-1-260 MG CAPS Take 1 capsule by mouth daily before breakfast. Patient not taking: Reported on 08/03/2015 03/03/15   Shelly Bombard, MD   BP 137/92 mmHg  Pulse 63  Temp(Src) 98.1 F (36.7 C) (Oral)  Resp 12  SpO2 100%  LMP 10/17/2014 Physical Exam  Constitutional: She is oriented to person, place, and time. She appears well-developed and well-nourished. No distress.  HENT:  Head: Normocephalic and atraumatic.  Mouth/Throat:    Swollen turbinates, no noted sinus tenderness to palpation  Eyes: EOM are normal. Pupils are equal, round, and reactive to light.  Neck: Normal range of motion. Neck supple.  Cardiovascular: Normal rate and regular rhythm.  Exam reveals no gallop and no friction rub.   No murmur heard. Pulmonary/Chest: Effort normal. She has no wheezes. She has no rales.  Abdominal: Soft. She exhibits no distension. There is no tenderness.  Musculoskeletal: She exhibits no edema or tenderness.  Neurological: She is alert and oriented to person, place, and time. She has normal strength. No cranial nerve deficit or sensory deficit. She displays a negative Romberg sign. Coordination and gait normal. GCS eye subscore is 4. GCS verbal subscore is 5. GCS motor subscore is 6. She displays no Babinski's sign on the right side. She displays no Babinski's sign on the left side.  Reflex Scores:      Tricep reflexes are 2+ on the right side and 2+ on the left side.      Bicep reflexes are 2+ on the right side and 2+ on the left side.      Brachioradialis reflexes are 2+ on the right side and 2+ on the left side.      Patellar reflexes are 2+ on the right side and 2+ on the left side.      Achilles reflexes are 2+ on  the right side and 2+ on the left side. Benign neuro exam  Skin: Skin is warm and dry. She is not diaphoretic.  Psychiatric: She has a normal mood and affect. Her behavior is normal.  Nursing note and vitals reviewed.   ED Course  .Nerve Block Date/Time: 10/11/2015 11:57 AM Performed by: Tyrone Nine Naylin Burkle Authorized by: Deno Etienne Consent: Verbal consent obtained. Risks and benefits: risks, benefits and alternatives were discussed Consent given by: patient Required items: required blood products, implants, devices, and special equipment available Patient identity confirmed: verbally with patient Indications: pain relief Body area: face/mouth Nerve: inferior alveolar Laterality: right Patient sedated: no Patient position: sitting Needle gauge: 41 G Location technique: anatomical landmarks Local anesthetic: bupivacaine 0.25% with epinephrine Anesthetic total: 10 ml Outcome:  pain improved Patient tolerance: Patient tolerated the procedure well with no immediate complications   (including critical care time) Labs Review Labs Reviewed - No data to display  Imaging Review No results found. I have personally reviewed and evaluated these images and lab results as part of my medical decision-making.   EKG Interpretation None      MDM   Final diagnoses:  Acute nonintractable headache, unspecified headache type  Dental caries    34 yo F  With a chief complaint of a headache. Pain is reproduced with palpation of her fractured molar. Dental block performed at bedside. Patient feeling much better. We'll discharge her home with penicillin. Dental and PCP follow-up.  11:58 AM:  I have discussed the diagnosis/risks/treatment options with the patient and believe the pt to be eligible for discharge home to follow-up with PCP/Dentist. We also discussed returning to the ED immediately if new or worsening sx occur. We discussed the sx which are most concerning (e.g., sudden worsening pain,  fever, inability to tolerate by mouth) that necessitate immediate return. Medications administered to the patient during their visit and any new prescriptions provided to the patient are listed below.  Medications given during this visit Medications  bupivacaine (PF) (MARCAINE) 0.25 % injection 10 mL (not administered)  ibuprofen (ADVIL,MOTRIN) tablet 800 mg (800 mg Oral Given 10/11/15 0943)  acetaminophen (TYLENOL) tablet 1,000 mg (1,000 mg Oral Given 10/11/15 0943)    Discharge Medication List as of 10/11/2015 10:09 AM    START taking these medications   Details  penicillin v potassium (VEETID) 500 MG tablet Take 1 tablet (500 mg total) by mouth 4 (four) times daily., Starting 10/11/2015, Until Tue 10/18/15, Print        The patient appears reasonably screen and/or stabilized for discharge and I doubt any other medical condition or other [facility] requiring further screening, evaluation, or treatment in the ED at this time prior to discharge.     Deno Etienne, DO 10/11/15 1158

## 2015-10-17 ENCOUNTER — Telehealth: Payer: Self-pay | Admitting: *Deleted

## 2015-10-17 NOTE — Telephone Encounter (Signed)
Patient is requesting a refill on her diflucan for yeast and Flexeril for back spasms. Patient states she has back spasms when she would normally have her cycle- she has had a hysterectomy.Her last medication was 04/2015 with 2 RF. Told patient I would check with provider and let her know.

## 2015-10-18 ENCOUNTER — Other Ambulatory Visit: Payer: Self-pay | Admitting: Obstetrics

## 2015-10-18 ENCOUNTER — Emergency Department (HOSPITAL_COMMUNITY)
Admission: EM | Admit: 2015-10-18 | Discharge: 2015-10-19 | Disposition: A | Discharge status: Home / Self Care | Payer: Medicaid Other | Attending: Emergency Medicine | Admitting: Emergency Medicine

## 2015-10-18 ENCOUNTER — Encounter (HOSPITAL_COMMUNITY): Payer: Self-pay | Admitting: Emergency Medicine

## 2015-10-18 DIAGNOSIS — R110 Nausea: Secondary | ICD-10-CM | POA: Diagnosis not present

## 2015-10-18 DIAGNOSIS — Z8742 Personal history of other diseases of the female genital tract: Secondary | ICD-10-CM | POA: Diagnosis not present

## 2015-10-18 DIAGNOSIS — M542 Cervicalgia: Secondary | ICD-10-CM | POA: Insufficient documentation

## 2015-10-18 DIAGNOSIS — Z720 Tobacco use: Secondary | ICD-10-CM | POA: Insufficient documentation

## 2015-10-18 DIAGNOSIS — Z792 Long term (current) use of antibiotics: Secondary | ICD-10-CM | POA: Insufficient documentation

## 2015-10-18 DIAGNOSIS — B373 Candidiasis of vulva and vagina: Secondary | ICD-10-CM

## 2015-10-18 DIAGNOSIS — G43C0 Periodic headache syndromes in child or adult, not intractable: Secondary | ICD-10-CM | POA: Insufficient documentation

## 2015-10-18 DIAGNOSIS — M545 Low back pain, unspecified: Secondary | ICD-10-CM

## 2015-10-18 DIAGNOSIS — R51 Headache: Secondary | ICD-10-CM | POA: Diagnosis present

## 2015-10-18 DIAGNOSIS — R42 Dizziness and giddiness: Secondary | ICD-10-CM | POA: Diagnosis not present

## 2015-10-18 DIAGNOSIS — Z79899 Other long term (current) drug therapy: Secondary | ICD-10-CM | POA: Insufficient documentation

## 2015-10-18 DIAGNOSIS — Z8619 Personal history of other infectious and parasitic diseases: Secondary | ICD-10-CM | POA: Insufficient documentation

## 2015-10-18 DIAGNOSIS — B3731 Acute candidiasis of vulva and vagina: Secondary | ICD-10-CM

## 2015-10-18 DIAGNOSIS — Z86018 Personal history of other benign neoplasm: Secondary | ICD-10-CM | POA: Diagnosis not present

## 2015-10-18 MED ORDER — OXYCODONE-ACETAMINOPHEN 5-325 MG PO TABS
1.0000 | ORAL_TABLET | Freq: Once | ORAL | Status: AC
  Administered 2015-10-18: 1 via ORAL
  Filled 2015-10-18: qty 1

## 2015-10-18 MED ORDER — SODIUM CHLORIDE 0.9 % IV SOLN
1000.0000 mL | Freq: Once | INTRAVENOUS | Status: AC
  Administered 2015-10-19: 1000 mL via INTRAVENOUS

## 2015-10-18 MED ORDER — CYCLOBENZAPRINE HCL 10 MG PO TABS: 10.0000 mg | ORAL_TABLET | Freq: Three times a day (TID) | ORAL | Status: DC | PRN

## 2015-10-18 MED ORDER — METOCLOPRAMIDE HCL 5 MG/ML IJ SOLN
10.0000 mg | Freq: Once | INTRAMUSCULAR | Status: AC
  Administered 2015-10-19: 10 mg via INTRAVENOUS
  Filled 2015-10-18: qty 2

## 2015-10-18 MED ORDER — SODIUM CHLORIDE 0.9 % IV SOLN: 1000.0000 mL | INTRAVENOUS | Status: DC

## 2015-10-18 MED ORDER — DEXAMETHASONE SODIUM PHOSPHATE 10 MG/ML IJ SOLN
10.0000 mg | Freq: Once | INTRAMUSCULAR | Status: AC
  Administered 2015-10-19: 10 mg via INTRAVENOUS
  Filled 2015-10-18: qty 1

## 2015-10-18 MED ORDER — FLUCONAZOLE 150 MG PO TABS: 150.0000 mg | ORAL_TABLET | Freq: Once | ORAL | Status: DC

## 2015-10-18 MED ORDER — DIPHENHYDRAMINE HCL 50 MG/ML IJ SOLN
25.0000 mg | Freq: Once | INTRAMUSCULAR | Status: AC
  Administered 2015-10-19: 25 mg via INTRAVENOUS
  Filled 2015-10-18: qty 1

## 2015-10-18 NOTE — ED Notes (Signed)
Patient in the restroom.

## 2015-10-18 NOTE — Telephone Encounter (Signed)
9:22 Patient called to check on status of refills- states she is spotting now.  11:51 LM on VM- Rx has been forwarded to her pharmacy- she has had a hysterectomy - please contact us regarding bleeding.

## 2015-10-18 NOTE — ED Notes (Signed)
Pt. reports migraine headache onset last week with blurred vision / lightheaded , denies head injury / no fever , no nausea or vomitting .

## 2015-10-18 NOTE — ED Provider Notes (Signed)
CSN: [id number]     Arrival date & time 10/18/15  1903 History   By signing my name below, I, Anna Schroeder, attest that this documentation has been prepared under the direction and in the presence of Delora Fuel, MD.  Electronically Signed: Forrestine Schroeder, ED Scribe. 10/18/2015. 11:38 PM.   Chief Complaint  Patient presents with  . Migraine   The history is provided by the patient. No language interpreter was used.    HPI Comments: Anna Schroeder is a 34 y.o. female without any pertinent past medical history who presents to the Emergency Department complaining of a constant, ongoing, diffuse HA; worse in frontal region x 1 week. Pain is described as pressure and currently rated 7/10. Discomfort is made worse when wearing her glasses, having objects on her head such as a hat, and smells. Ongoing nausea, a short episode of mild tingling to the L arm yesterday, and lightheadedness also reported. OTC extra strength Tylenol attempted at home without any improvement for symptoms. No recent fever, chills, vomiting, chest pain, or shortness of breath. Ms. Ades was evaluated on 10/18 for same and states current HA feels similar.  PCP: Dr. Lucianne Lei    Past Medical History  Diagnosis Date  . Chlamydia   . Fibroids   . BV (bacterial vaginosis)   . Vaginal yeast infection   . Family history of anesthesia complication     parents were slow to wake up  . Pelvic mass    Past Surgical History  Procedure Laterality Date  . Tubal ligation    . Robotic assisted total hysterectomy N/A 10/29/2014    Procedure: ROBOTIC ASSISTED TOTAL HYSTERECTOMY, LEFT SALPINGOOOPHERECTOMY, RIGHT SALPINGECTOMY;  Surgeon: Lahoma Crocker, MD;  Location: [facility];  Service: Gynecology;  Laterality: N/A;   Family History  Problem Relation Age of Onset  . Hypertension Mother   . Heart disease Mother   . Arthritis Mother   . Alcohol abuse Father   . Arthritis Father   . Asthma Father   . Diabetes Father   . Drug abuse  Father   . Heart disease Father   . Hyperlipidemia Father   . Hypertension Father   . Kidney disease Father   . Stroke Father    Social History  Substance Use Topics  . Smoking status: Current Some Day Smoker -- 0.50 packs/day    Types: Cigarettes  . Smokeless tobacco: None  . Alcohol Use: Yes     Comment: Occassionally   OB History    Gravida Para Term Preterm AB TAB SAB Ectopic Multiple Living   3 3 3       3      Review of Systems  Constitutional: Negative for fever and chills.  Respiratory: Negative for cough and shortness of breath.   Cardiovascular: Negative for chest pain.  Gastrointestinal: Positive for nausea. Negative for vomiting and abdominal pain.  Skin: Negative for rash.  Neurological: Positive for light-headedness and headaches.  Psychiatric/Behavioral: Negative for confusion.  All other systems reviewed and are negative.     Allergies  Review of patient's allergies indicates no known allergies.  Home Medications   Prior to Admission medications   Medication Sig Start Date End Date Taking? Authorizing Provider  clindamycin (CLEOCIN) 150 MG capsule Take 1 capsule (150 mg total) by mouth 3 (three) times daily. 09/23/15   Billy Fischer, MD  cyclobenzaprine (FLEXERIL) 10 MG tablet Take 1 tablet (10 mg total) by mouth every 8 (eight) hours as needed for  muscle spasms. 10/18/15   Shelly Bombard, MD  diclofenac (CATAFLAM) 50 MG tablet Take 1 tablet (50 mg total) by mouth 3 (three) times daily. For tooth pain 09/23/15   Billy Fischer, MD  fluconazole (DIFLUCAN) 150 MG tablet Take 1 tablet (150 mg total) by mouth once. 10/18/15   Shelly Bombard, MD  methocarbamol (ROBAXIN) 500 MG tablet Take 1 tablet (500 mg total) by mouth every 8 (eight) hours as needed for muscle spasms. Patient not taking: Reported on 08/03/2015 04/29/15   Shelly Bombard, MD  penicillin v potassium (VEETID) 500 MG tablet Take 1 tablet (500 mg total) by mouth 4 (four) times daily. 10/11/15  10/18/15  Deno Etienne, DO  Prenat-FeFmCb-DSS-FA-DHA w/o A (CITRANATAL HARMONY) 27-1-260 MG CAPS Take 1 capsule by mouth daily before breakfast. Patient not taking: Reported on 08/03/2015 03/03/15   Shelly Bombard, MD   Triage Vitals: BP 146/91 mmHg  Pulse 67  Temp(Src) 98 F (36.7 C) (Oral)  Resp 16  SpO2 100%  LMP 10/17/2014   Physical Exam  Constitutional: She is oriented to person, place, and time. She appears well-developed and well-nourished. No distress.  HENT:  Head: Normocephalic and atraumatic.  Tender to R temporalis muscle and bilateral paracervical muscles   Eyes: EOM are normal. Pupils are equal, round, and reactive to light.  Neck: Normal range of motion. Neck supple. No JVD present.  Nontender  Cardiovascular: Normal rate, regular rhythm and normal heart sounds.   No murmur heard. Pulmonary/Chest: Effort normal and breath sounds normal. She has no wheezes. She has no rales. She exhibits no tenderness.  Abdominal: Soft. Bowel sounds are normal. She exhibits no distension and no mass. There is no tenderness.  Musculoskeletal: Normal range of motion. She exhibits no edema.  Lymphadenopathy:    She has no cervical adenopathy.  Neurological: She is alert and oriented to person, place, and time. No cranial nerve deficit. She exhibits normal muscle tone. Coordination normal.  Skin: Skin is warm and dry. No rash noted.  Psychiatric: She has a normal mood and affect. Her behavior is normal. Judgment and thought content normal.  Nursing note and vitals reviewed.   ED Course  Procedures (including critical care time)  DIAGNOSTIC STUDIES: Oxygen Saturation is 100% on RA, Normal by my interpretation.    COORDINATION OF CARE: 11:33 PM- Will give fluids, Reglan, Benadryl, Decadron, and Percocet. Discussed treatment plan with pt at bedside and pt agreed to plan.     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 She is given a headache cocktail of IV fluids, and a clopamide, diphenhydramine, and dexamethasone. Following this, headache is completely resolved. Old records reviewed and there are no relevant past visits. She is discharged with prescription for metoclopramide.  I personally performed the services described in this documentation, which was scribed in my presence. The recorded information has been reviewed and is accurate.      Delora Fuel, MD 02/63/78 [id number]

## 2015-10-18 NOTE — Telephone Encounter (Signed)
Flexeril and Diflucan Rx

## 2015-10-19 MED ORDER — METOCLOPRAMIDE HCL 10 MG PO TABS: 10.0000 mg | ORAL_TABLET | Freq: Four times a day (QID) | ORAL | Status: DC | PRN

## 2015-10-19 NOTE — Discharge Instructions (Signed)
Migraine Headache A migraine headache is an intense, throbbing pain on one or both sides of your head. A migraine can last for 30 minutes to several hours. CAUSES  The exact cause of a migraine headache is not always known. However, a migraine may be caused when nerves in the brain become irritated and release chemicals that cause inflammation. This causes pain. Certain things may also trigger migraines, such as:  Alcohol.  Smoking.  Stress.  Menstruation.  Aged cheeses.  Foods or drinks that contain nitrates, glutamate, aspartame, or tyramine.  Lack of sleep.  Chocolate.  Caffeine.  Hunger.  Physical exertion.  Fatigue.  Medicines used to treat chest pain (nitroglycerine), birth control pills, estrogen, and some blood pressure medicines. SIGNS AND SYMPTOMS  Pain on one or both sides of your head.  Pulsating or throbbing pain.  Severe pain that prevents daily activities.  Pain that is aggravated by any physical activity.  Nausea, vomiting, or both.  Dizziness.  Pain with exposure to bright lights, loud noises, or activity.  General sensitivity to bright lights, loud noises, or smells. Before you get a migraine, you may get warning signs that a migraine is coming (aura). An aura may include:  Seeing flashing lights.  Seeing bright spots, halos, or zigzag lines.  Having tunnel vision or blurred vision.  Having feelings of numbness or tingling.  Having trouble talking.  Having muscle weakness. DIAGNOSIS  A migraine headache is often diagnosed based on:  Symptoms.  Physical exam.  A CT scan or MRI of your head. These imaging tests cannot diagnose migraines, but they can help rule out other causes of headaches. TREATMENT Medicines may be given for pain and nausea. Medicines can also be given to help prevent recurrent migraines.  HOME CARE INSTRUCTIONS  Only take over-the-counter or prescription medicines for pain or discomfort as directed by your  health care provider. The use of long-term narcotics is not recommended.  Lie down in a dark, quiet room when you have a migraine.  Keep a journal to find out what may trigger your migraine headaches. For example, write down:  What you eat and drink.  How much sleep you get.  Any change to your diet or medicines.  Limit alcohol consumption.  Quit smoking if you smoke.  Get 7-9 hours of sleep, or as recommended by your health care provider.  Limit stress.  Keep lights dim if bright lights bother you and make your migraines worse. SEEK IMMEDIATE MEDICAL CARE IF:   Your migraine becomes severe.  You have a fever.  You have a stiff neck.  You have vision loss.  You have muscular weakness or loss of muscle control.  You start losing your balance or have trouble walking.  You feel faint or pass out.  You have severe symptoms that are different from your first symptoms. MAKE SURE YOU:   Understand these instructions.  Will watch your condition.  Will get help right away if you are not doing well or get worse.   This information is not intended to replace advice given to you by your health care provider. Make sure you discuss any questions you have with your health care provider.   Document Released: 12/10/2005 Document Revised: 12/31/2014 Document Reviewed: 08/17/2013 Elsevier Interactive Patient Education 2016 Elsevier Inc.  Metoclopramide tablets What is this medicine? METOCLOPRAMIDE (met oh kloe PRA mide) is used to treat the symptoms of gastroesophageal reflux disease (GERD) like heartburn. It is also used to treat people with slow  emptying of the stomach and intestinal tract. This medicine may be used for other purposes; ask your health care provider or pharmacist if you have questions. What should I tell my health care provider before I take this medicine? They need to know if you have any of these conditions: -breast cancer -depression -diabetes -heart  failure -high blood pressure -kidney disease -liver disease -Parkinson's disease or a movement disorder -pheochromocytoma -seizures -stomach obstruction, bleeding, or perforation -an unusual or allergic reaction to metoclopramide, procainamide, sulfites, other medicines, foods, dyes, or preservatives -pregnant or trying to get pregnant -breast-feeding How should I use this medicine? Take this medicine by mouth with a glass of water. Follow the directions on the prescription label. Take this medicine on an empty stomach, about 30 minutes before eating. Take your doses at regular intervals. Do not take your medicine more often than directed. Do not stop taking except on the advice of your doctor or health care professional. A special MedGuide will be given to you by the pharmacist with each prescription and refill. Be sure to read this information carefully each time. Talk to your pediatrician regarding the use of this medicine in children. Special care may be needed. Overdosage: If you think you have taken too much of this medicine contact a poison control center or emergency room at once. NOTE: This medicine is only for you. Do not share this medicine with others. What if I miss a dose? If you miss a dose, take it as soon as you can. If it is almost time for your next dose, take only that dose. Do not take double or extra doses. What may interact with this medicine? -acetaminophen -cyclosporine -digoxin -medicines for blood pressure -medicines for diabetes, including insulin -medicines for hay fever and other allergies -medicines for depression, especially an Monoamine Oxidase Inhibitor (MAOI) -medicines for Parkinson's disease, like levodopa -medicines for sleep or for pain -tetracycline This list may not describe all possible interactions. Give your health care provider a list of all the medicines, herbs, non-prescription drugs, or dietary supplements you use. Also tell them if you  smoke, drink alcohol, or use illegal drugs. Some items may interact with your medicine. What should I watch for while using this medicine? It may take a few weeks for your stomach condition to start to get better. However, do not take this medicine for longer than 12 weeks. The longer you take this medicine, and the more you take it, the greater your chances are of developing serious side effects. If you are an elderly patient, a female patient, or you have diabetes, you may be at an increased risk for side effects from this medicine. Contact your doctor immediately if you start having movements you cannot control such as lip smacking, rapid movements of the tongue, involuntary or uncontrollable movements of the eyes, head, arms and legs, or muscle twitches and spasms. Patients and their families should watch out for worsening depression or thoughts of suicide. Also watch out for any sudden or severe changes in feelings such as feeling anxious, agitated, panicky, irritable, hostile, aggressive, impulsive, severely restless, overly excited and hyperactive, or not being able to sleep. If this happens, especially at the beginning of treatment or after a change in dose, call your doctor. Do not treat yourself for high fever. Ask your doctor or health care professional for advice. You may get drowsy or dizzy. Do not drive, use machinery, or do anything that needs mental alertness until you know how this drug affects  you. Do not stand or sit up quickly, especially if you are an older patient. This reduces the risk of dizzy or fainting spells. Alcohol can make you more drowsy and dizzy. Avoid alcoholic drinks. What side effects may I notice from receiving this medicine? Side effects that you should report to your doctor or health care professional as soon as possible: -allergic reactions like skin rash, itching or hives, swelling of the face, lips, or tongue -abnormal production of milk in females -breast  enlargement in both males and females -change in the way you walk -difficulty moving, speaking or swallowing -drooling, lip smacking, or rapid movements of the tongue -excessive sweating -fever -involuntary or uncontrollable movements of the eyes, head, arms and legs -irregular heartbeat or palpitations -muscle twitches and spasms -unusually weak or tired Side effects that usually do not require medical attention (report to your doctor or health care professional if they continue or are bothersome): -change in sex drive or performance -depressed mood -diarrhea -difficulty sleeping -headache -menstrual changes -restless or nervous This list may not describe all possible side effects. Call your doctor for medical advice about side effects. You may report side effects to FDA at 1-800-FDA-1088. Where should I keep my medicine? Keep out of the reach of children. Store at room temperature between 20 and 25 degrees C (68 and 77 degrees F). Protect from light. Keep container tightly closed. Throw away any unused medicine after the expiration date. NOTE: This sheet is a summary. It may not cover all possible information. If you have questions about this medicine, talk to your doctor, pharmacist, or health care provider.    2016, Elsevier/Gold Standard. (2012-04-08 13:04:38)

## 2015-10-19 NOTE — ED Notes (Signed)
Dr. Glick at the bedside.  

## 2015-11-10 ENCOUNTER — Encounter (HOSPITAL_COMMUNITY): Payer: Self-pay

## 2015-11-10 ENCOUNTER — Emergency Department (HOSPITAL_COMMUNITY)
Admission: EM | Admit: 2015-11-10 | Discharge: 2015-11-10 | Disposition: A | Discharge status: Home / Self Care | Payer: Medicaid Other | Attending: Emergency Medicine | Admitting: Emergency Medicine

## 2015-11-10 DIAGNOSIS — Z8742 Personal history of other diseases of the female genital tract: Secondary | ICD-10-CM | POA: Insufficient documentation

## 2015-11-10 DIAGNOSIS — K0889 Other specified disorders of teeth and supporting structures: Secondary | ICD-10-CM | POA: Diagnosis present

## 2015-11-10 DIAGNOSIS — F17210 Nicotine dependence, cigarettes, uncomplicated: Secondary | ICD-10-CM | POA: Insufficient documentation

## 2015-11-10 DIAGNOSIS — K029 Dental caries, unspecified: Secondary | ICD-10-CM | POA: Diagnosis not present

## 2015-11-10 DIAGNOSIS — Z86018 Personal history of other benign neoplasm: Secondary | ICD-10-CM | POA: Insufficient documentation

## 2015-11-10 DIAGNOSIS — Z8619 Personal history of other infectious and parasitic diseases: Secondary | ICD-10-CM | POA: Diagnosis not present

## 2015-11-10 DIAGNOSIS — K002 Abnormalities of size and form of teeth: Secondary | ICD-10-CM | POA: Diagnosis not present

## 2015-11-10 MED ORDER — ACETAMINOPHEN 325 MG PO TABS
650.0000 mg | ORAL_TABLET | Freq: Once | ORAL | Status: AC
  Administered 2015-11-10: 650 mg via ORAL
  Filled 2015-11-10: qty 2

## 2015-11-10 MED ORDER — ACETAMINOPHEN 500 MG PO TABS: 500.0000 mg | ORAL_TABLET | Freq: Four times a day (QID) | ORAL | Status: DC | PRN

## 2015-11-10 NOTE — ED Notes (Signed)
Pt states she has been having dental pain (top left and bottom right of mouth), was seen here for this last month and prescribed PCN, finished on 10/19. She states he dental pain has worsened and caused her head to hurt. She went to see a dentist and has an appt tomorrow to have the teeth pulled but was told she couldn't get the teeth pulled until her BP decreased.

## 2015-11-10 NOTE — ED Notes (Signed)
See PA summary for further assessment.

## 2015-11-10 NOTE — ED Provider Notes (Signed)
CSN: [id number]     Arrival date & time 11/10/15  1602 History  By signing my name below, I, Anna Schroeder, attest that this documentation has been prepared under the direction and in the presence of [employer], PA-C. Electronically Signed: Helane Schroeder, ED Scribe. 11/10/2015. 5:00 PM.    Chief Complaint  Patient presents with  . Dental Pain   The history is provided by the patient. No language interpreter was used.   HPI Comments: Anna Schroeder is a 34 y.o. female smoker at 0.5 ppd who presents to the Emergency Department complaining of worsening, constant, aching, upper left and lower right dental pain onset over a month ago. Pt states it feels as though "someone is sitting on my head." She reports associated HA and pain along the lower jaw. She notes she was seen for the same in the ED last month when she was prescribed penicillin, and has since seen a dentist. She states that the dentist will not pull the teeth tomorrow, because her blood pressure was too high during the evaluation appointment (155/117). She states she will not be able to see her PCP until 11/23. She notes she has come to the ED today to get her blood pressure down. She states she does not take any medication for her BP. Pt denies dizziness, lightheadedness, visual disturbances, slurred speech, facial droop, one-sided weakness, fever, neck stiffness, difficulty walking, facial swelling, N/V, abdominal pain, or urinary symptoms.   Past Medical History  Diagnosis Date  . Chlamydia   . Fibroids   . BV (bacterial vaginosis)   . Vaginal yeast infection   . Family history of anesthesia complication     parents were slow to wake up  . Pelvic mass    Past Surgical History  Procedure Laterality Date  . Tubal ligation    . Robotic assisted total hysterectomy N/A 10/29/2014    Procedure: ROBOTIC ASSISTED TOTAL HYSTERECTOMY, LEFT SALPINGOOOPHERECTOMY, RIGHT SALPINGECTOMY;  Surgeon: Lahoma Crocker, MD;  Location: [facility];   Service: Gynecology;  Laterality: N/A;   Family History  Problem Relation Age of Onset  . Hypertension Mother   . Heart disease Mother   . Arthritis Mother   . Alcohol abuse Father   . Arthritis Father   . Asthma Father   . Diabetes Father   . Drug abuse Father   . Heart disease Father   . Hyperlipidemia Father   . Hypertension Father   . Kidney disease Father   . Stroke Father    Social History  Substance Use Topics  . Smoking status: Current Some Day Smoker -- 0.50 packs/day    Types: Cigarettes  . Smokeless tobacco: None  . Alcohol Use: Yes     Comment: Occassionally   OB History    Gravida Para Term Preterm AB TAB SAB Ectopic Multiple Living   3 3 3       3      Review of Systems A complete 10 system review of systems was obtained and all systems are negative except as noted in the HPI and PMH.   Allergies  Review of patient's allergies indicates no known allergies.  Home Medications   Prior to Admission medications   Medication Sig Start Date End Date Taking? Authorizing Provider  acetaminophen (TYLENOL) 500 MG tablet Take 1 tablet (500 mg total) by mouth every 6 (six) hours as needed. 11/10/15   Gloriann Loan, PA-C  cyclobenzaprine (FLEXERIL) 10 MG tablet Take 1 tablet (10 mg total) by mouth  every 8 (eight) hours as needed for muscle spasms. 10/18/15   Shelly Bombard, MD  metoCLOPramide (REGLAN) 10 MG tablet Take 1 tablet (10 mg total) by mouth every 6 (six) hours as needed for nausea (or headache). [id number]   Delora Fuel, MD   BP [id number] mmHg  Pulse 86  Temp(Src) 99 F (37.2 C) (Oral)  Resp 16  Ht 5\' 2"  (1.575 m)  Wt 140 lb (63.504 kg)  BMI 25.60 kg/m2  SpO2 100%  LMP 10/17/2014 Physical Exam  Constitutional: She is oriented to person, place, and time. She appears well-developed and well-nourished.  HENT:  Head: Normocephalic and atraumatic.  Right Ear: External ear normal.  Left Ear: External ear normal.  Nose: Nose normal.  Mouth/Throat: Uvula is  midline, oropharynx is clear and moist and mucous membranes are normal. No trismus in the jaw. Abnormal dentition. Dental caries present. No dental abscesses or uvula swelling. No oropharyngeal exudate, posterior oropharyngeal edema, posterior oropharyngeal erythema or tonsillar abscesses.  Eyes: Conjunctivae and EOM are normal. Pupils are equal, round, and reactive to light. Right eye exhibits no discharge. Left eye exhibits no discharge.  Neck: Normal range of motion. Neck supple.  No Ludwig angina.  No facial swelling.   Cardiovascular: Normal rate, regular rhythm and normal heart sounds.   Pulmonary/Chest: Effort normal and breath sounds normal. No respiratory distress. She has no wheezes. She has no rales.  Abdominal: Soft. Bowel sounds are normal. She exhibits no distension.  Musculoskeletal: Normal range of motion.  Lymphadenopathy:    She has no cervical adenopathy.  Neurological: She is alert and oriented to person, place, and time. Coordination normal.  Mental Status:   AOx3.  Speech clear without dysarthria. Cranial Nerves:  I-not tested  II-PERRLA  III, IV, VI-EOMs intact  V-temporal and masseter strength intact  VII-symmetrical facial movements intact, no facial droop  VIII-hearing grossly intact bilaterally  IX, X-gag intact  XI-strength of sternomastoid and trapezius muscles 5/5  XII-tongue midline Motor:   Good muscle bulk and tone  Strength 5/5 bilaterally in upper and lower extremities   Cerebellar--RAMs, finger to nose intact  Romberg--maintains balance with eyes closed  Casual and tandem gait normal without ataxia  No pronator drift Sensory:  Intact in upper and lower extremities   Skin: Skin is warm and dry. No rash noted. She is not diaphoretic. No erythema.  Psychiatric: She has a normal mood and affect.  Nursing note and vitals reviewed.   ED Course  Procedures  DIAGNOSTIC STUDIES: Oxygen Saturation is 100% on RA, normal by my interpretation.     COORDINATION OF CARE: 4:59 PM - Discussed plans to try giving tylenol for the headache. Pt advised of plan for treatment and pt agrees.  Labs Review Labs Reviewed - No data to display  Imaging Review No results found. I have personally reviewed and evaluated these images and lab results as part of my medical decision-making.   EKG Interpretation None      MDM   Final diagnoses:  Pain, dental    Patient presents with ongoing dental pain; however, she presents today stating that her dentist will not pull her teeth unless her BP is controlled.  She has a PCP and has an appointment November 16, 2015.  VS today are Blood pressure 140/93, pulse 86, temperature 99 F (37.2 C), temperature source Oral, resp. rate 16, height 5\' 2"  (1.575 m), weight 140 lb (63.504 kg), last menstrual period 10/17/2014, SpO2 100 %.  No neurological deficits.  Remaining physical exam unremarkable.  Discussed importance of following up with her PCP for BP management, as we do not manage HTN in the emergency department unless it is a hypertensive emergency.  No signs or symptoms of end organ damage.  Evaluation does not show pathology requring ongoing emergent intervention or admission. Pt is hemodynamically stable and mentating appropriately. Discussed findings/results and plan with patient/guardian, who agrees with plan. All questions answered. Return precautions discussed and outpatient follow up given.    I personally performed the services described in this documentation, which was scribed in my presence. The recorded information has been reviewed and is accurate.   Gloriann Loan, PA-C 11/10/15 1724  Tanna Furry, MD 11/17/15 [phone]

## 2015-11-10 NOTE — Discharge Instructions (Signed)

## 2015-11-10 NOTE — ED Notes (Signed)
Patient is alert and orientedx4.  Patient was explained discharge instructions and they understood them with no questions.   

## 2016-03-05 ENCOUNTER — Ambulatory Visit: Payer: Medicaid Other | Admitting: Obstetrics

## 2016-03-26 ENCOUNTER — Ambulatory Visit: Payer: Medicaid Other | Admitting: Obstetrics

## 2016-05-03 ENCOUNTER — Ambulatory Visit (INDEPENDENT_AMBULATORY_CARE_PROVIDER_SITE_OTHER): Payer: Medicaid Other | Admitting: Obstetrics

## 2016-05-03 ENCOUNTER — Encounter: Payer: Self-pay | Admitting: Obstetrics

## 2016-05-03 VITALS — BP 123/77 | HR 78 | Wt 166.0 lb

## 2016-05-03 DIAGNOSIS — Z1389 Encounter for screening for other disorder: Secondary | ICD-10-CM | POA: Diagnosis not present

## 2016-05-03 DIAGNOSIS — Z01419 Encounter for gynecological examination (general) (routine) without abnormal findings: Secondary | ICD-10-CM | POA: Diagnosis not present

## 2016-05-03 DIAGNOSIS — M545 Low back pain, unspecified: Secondary | ICD-10-CM

## 2016-05-03 DIAGNOSIS — Z0000 Encounter for general adult medical examination without abnormal findings: Secondary | ICD-10-CM | POA: Diagnosis not present

## 2016-05-03 LAB — POCT URINALYSIS DIPSTICK
BILIRUBIN UA: NEGATIVE
Glucose, UA: NEGATIVE
KETONES UA: NEGATIVE
Leukocytes, UA: NEGATIVE
Nitrite, UA: NEGATIVE
PH UA: 6
RBC UA: NEGATIVE
Spec Grav, UA: 1.015
Urobilinogen, UA: NEGATIVE

## 2016-05-03 MED ORDER — CYCLOBENZAPRINE HCL 10 MG PO TABS: 10.0000 mg | ORAL_TABLET | Freq: Three times a day (TID) | ORAL | Status: DC | PRN

## 2016-05-03 NOTE — Progress Notes (Signed)
Subjective:        Anna Schroeder is a 35 y.o. female here for a routine exam.  Current complaints: None.    Personal health questionnaire:  Is patient Ashkenazi Jewish, have a family history of breast and/or ovarian cancer: no Is there a family history of uterine cancer diagnosed at age < 13, gastrointestinal cancer, urinary tract cancer, family member who is a Field seismologist syndrome-associated carrier: no Is the patient overweight and hypertensive, family history of diabetes, personal history of gestational diabetes, preeclampsia or PCOS: no Is patient over 60, have PCOS,  family history of premature CHD under age 39, diabetes, smoke, have hypertension or peripheral artery disease:  no At any time, has a partner hit, kicked or otherwise hurt or frightened you?: no Over the past 2 weeks, have you felt down, depressed or hopeless?: no Over the past 2 weeks, have you felt little interest or pleasure in doing things?:no   Gynecologic History Patient's last menstrual period was 10/17/2014. Contraception: status post hysterectomy Last Pap: unknown. Results were: unknown Last mammogram: n/a. Results were: n/a  Obstetric History OB History  Gravida Para Term Preterm AB SAB TAB Ectopic Multiple Living  3 3 3       3     # Outcome Date GA Lbr Len/2nd Weight Sex Delivery Anes PTL Lv  3 Term      Vag-Spont   Y  2 Term      CS-LTranv   Y  1 Term      Vag-Spont   Y      Past Medical History  Diagnosis Date  . Chlamydia   . Fibroids   . BV (bacterial vaginosis)   . Vaginal yeast infection   . Family history of anesthesia complication     parents were slow to wake up  . Pelvic mass     Past Surgical History  Procedure Laterality Date  . Tubal ligation    . Robotic assisted total hysterectomy N/A 10/29/2014    Procedure: ROBOTIC ASSISTED TOTAL HYSTERECTOMY, LEFT SALPINGOOOPHERECTOMY, RIGHT SALPINGECTOMY;  Surgeon: Lahoma Crocker, MD;  Location: [facility];  Service: Gynecology;  Laterality:  N/A;     Current outpatient prescriptions:  .  acetaminophen (TYLENOL) 500 MG tablet, Take 1 tablet (500 mg total) by mouth every 6 (six) hours as needed., Disp: 30 tablet, Rfl: 0 .  cyclobenzaprine (FLEXERIL) 10 MG tablet, Take 1 tablet (10 mg total) by mouth every 8 (eight) hours as needed for muscle spasms., Disp: 30 tablet, Rfl: 2 No Known Allergies  Social History  Substance Use Topics  . Smoking status: Current Some Day Smoker -- 0.50 packs/day    Types: Cigarettes  . Smokeless tobacco: Not on file  . Alcohol Use: Yes     Comment: Occassionally    Family History  Problem Relation Age of Onset  . Hypertension Mother   . Heart disease Mother   . Arthritis Mother   . Alcohol abuse Father   . Arthritis Father   . Asthma Father   . Diabetes Father   . Drug abuse Father   . Heart disease Father   . Hyperlipidemia Father   . Hypertension Father   . Kidney disease Father   . Stroke Father       Review of Systems  Constitutional: negative for fatigue and weight loss Respiratory: negative for cough and wheezing Cardiovascular: negative for chest pain, fatigue and palpitations Gastrointestinal: negative for abdominal pain and change in bowel habits Musculoskeletal:negative for  myalgias Neurological: negative for gait problems and tremors Behavioral/Psych: negative for abusive relationship, depression Endocrine: negative for temperature intolerance   Genitourinary:negative for abnormal menstrual periods, genital lesions, hot flashes, sexual problems and vaginal discharge Integument/breast: negative for breast lump, breast tenderness, nipple discharge and skin lesion(s)    Objective:       BP 123/77 mmHg  Pulse 78  Wt 166 lb (75.297 kg)  LMP 10/17/2014 General:   alert  Skin:   no rash or abnormalities  Lungs:   clear to auscultation bilaterally  Heart:   regular rate and rhythm, S1, S2 normal, no murmur, click, rub or gallop  Breasts:   normal without suspicious  masses, skin or nipple changes or axillary nodes  Abdomen:  normal findings: no organomegaly, soft, non-tender and no hernia  Pelvis:  External genitalia: normal general appearance Urinary system: urethral meatus normal and bladder without fullness, nontender Vaginal: normal without tenderness, induration or masses Cervix: absent Adnexa: normal bimanual exam Uterus: absent   Lab Review Urine pregnancy test Labs reviewed yes Radiologic studies reviewed no    Assessment:    Healthy female exam.    Plan:    Safe sex and STD prevention stressed.  Education reviewed: calcium supplements, depression evaluation, low fat, low cholesterol diet, self breast exams and weight bearing exercise. Follow up in: 1 year.   No orders of the defined types were placed in this encounter.   Orders Placed This Encounter  Procedures  . POCT urinalysis dipstick

## 2016-05-03 NOTE — Addendum Note (Signed)
Addended by: Lewie Loron D on: 05/03/2016 04:44 PM   Modules accepted: Orders

## 2016-05-08 LAB — NUSWAB VG, CANDIDA 6SP
CANDIDA ALBICANS, NAA: NEGATIVE
CANDIDA GLABRATA, NAA: NEGATIVE
CANDIDA PARAPSILOSIS, NAA: NEGATIVE
Candida krusei, NAA: NEGATIVE
Candida lusitaniae, NAA: NEGATIVE
Candida tropicalis, NAA: NEGATIVE
TRICH VAG BY NAA: NEGATIVE

## 2016-05-31 ENCOUNTER — Encounter (HOSPITAL_COMMUNITY): Payer: Self-pay | Admitting: Emergency Medicine

## 2016-05-31 ENCOUNTER — Emergency Department (HOSPITAL_COMMUNITY)
Admission: EM | Admit: 2016-05-31 | Discharge: 2016-05-31 | Disposition: A | Discharge status: Home / Self Care | Payer: Medicaid Other | Attending: Emergency Medicine | Admitting: Emergency Medicine

## 2016-05-31 DIAGNOSIS — Y929 Unspecified place or not applicable: Secondary | ICD-10-CM | POA: Diagnosis not present

## 2016-05-31 DIAGNOSIS — S61012A Laceration without foreign body of left thumb without damage to nail, initial encounter: Secondary | ICD-10-CM | POA: Insufficient documentation

## 2016-05-31 DIAGNOSIS — Y990 Civilian activity done for income or pay: Secondary | ICD-10-CM | POA: Diagnosis not present

## 2016-05-31 DIAGNOSIS — F17210 Nicotine dependence, cigarettes, uncomplicated: Secondary | ICD-10-CM | POA: Diagnosis not present

## 2016-05-31 DIAGNOSIS — Z79899 Other long term (current) drug therapy: Secondary | ICD-10-CM | POA: Insufficient documentation

## 2016-05-31 DIAGNOSIS — Y939 Activity, unspecified: Secondary | ICD-10-CM | POA: Insufficient documentation

## 2016-05-31 DIAGNOSIS — W278XXA Contact with other nonpowered hand tool, initial encounter: Secondary | ICD-10-CM | POA: Insufficient documentation

## 2016-05-31 DIAGNOSIS — Z23 Encounter for immunization: Secondary | ICD-10-CM | POA: Insufficient documentation

## 2016-05-31 MED ORDER — LIDOCAINE HCL (PF) 1 % IJ SOLN
5.0000 mL | Freq: Once | INTRAMUSCULAR | Status: AC
  Administered 2016-05-31: 5 mL
  Filled 2016-05-31: qty 5

## 2016-05-31 MED ORDER — BACITRACIN ZINC 500 UNIT/GM EX OINT: TOPICAL_OINTMENT | Freq: Two times a day (BID) | CUTANEOUS | Status: DC

## 2016-05-31 MED ORDER — TETANUS-DIPHTH-ACELL PERTUSSIS 5-2.5-18.5 LF-MCG/0.5 IM SUSP
0.5000 mL | Freq: Once | INTRAMUSCULAR | Status: AC
  Administered 2016-05-31: 0.5 mL via INTRAMUSCULAR
  Filled 2016-05-31: qty 0.5

## 2016-05-31 NOTE — ED Notes (Signed)
Pt st's she cut her thumb on a slicer at work.  Pt has lac to pad of left thumb

## 2016-05-31 NOTE — ED Provider Notes (Signed)
CSN: [id number]     Arrival date & time 05/31/16  1551 History  By signing my name below, I, Eustaquio Maize, attest that this documentation has been prepared under the direction and in the presence of Debroah Baller, NP. Electronically Signed: Eustaquio Maize, ED Scribe. 05/31/2016. 4:40 PM.  Chief Complaint  Patient presents with  . Laceration    Patient is a 35 y.o. female presenting with skin laceration. The history is provided by the patient. No language interpreter was used.  Laceration Location:  Finger Finger laceration location:  L thumb Length (cm):  2 cm Bleeding: controlled   Time since incident:  2 hours Laceration mechanism:  Metal edge (meat slicer) Pain details:    Severity:  Mild   Timing:  Constant   Progression:  Unchanged Tetanus status:  Out of date  HPI Comments: Anna Schroeder is a 35 y.o. female who presents to the Emergency Department complaining of laceration to left thumb tip that occurred earlier today. Pt states that laceration occurred at work while utilizing the Astronomer. Bleeding is controlled. Pt notes mild pain to the area. Tetanus not up to date. Denies any other associated symptoms.    Past Medical History  Diagnosis Date  . Chlamydia   . Fibroids   . BV (bacterial vaginosis)   . Vaginal yeast infection   . Family history of anesthesia complication     parents were slow to wake up  . Pelvic mass    Past Surgical History  Procedure Laterality Date  . Tubal ligation    . Robotic assisted total hysterectomy N/A 10/29/2014    Procedure: ROBOTIC ASSISTED TOTAL HYSTERECTOMY, LEFT SALPINGOOOPHERECTOMY, RIGHT SALPINGECTOMY;  Surgeon: Lahoma Crocker, MD;  Location: [facility];  Service: Gynecology;  Laterality: N/A;   Family History  Problem Relation Age of Onset  . Hypertension Mother   . Heart disease Mother   . Arthritis Mother   . Alcohol abuse Father   . Arthritis Father   . Asthma Father   . Diabetes Father   . Drug abuse Father   . Heart  disease Father   . Hyperlipidemia Father   . Hypertension Father   . Kidney disease Father   . Stroke Father    Social History  Substance Use Topics  . Smoking status: Current Some Day Smoker -- 0.50 packs/day    Types: Cigarettes  . Smokeless tobacco: None  . Alcohol Use: Yes     Comment: Occassionally   OB History    Gravida Para Term Preterm AB TAB SAB Ectopic Multiple Living   3 3 3       3      Review of Systems  Musculoskeletal: Positive for arthralgias.  Skin: Positive for wound.  All other systems reviewed and are negative.  Allergies  Review of patient's allergies indicates no known allergies.  Home Medications   Prior to Admission medications   Medication Sig Start Date End Date Taking? Authorizing Provider  acetaminophen (TYLENOL) 500 MG tablet Take 1 tablet (500 mg total) by mouth every 6 (six) hours as needed. 11/10/15   Gloriann Loan, PA-C  cyclobenzaprine (FLEXERIL) 10 MG tablet Take 1 tablet (10 mg total) by mouth every 8 (eight) hours as needed for muscle spasms. 05/03/16   Shelly Bombard, MD   BP 139/92 mmHg  Pulse 75  Temp(Src) 98.2 F (36.8 C) (Oral)  Resp 18  Ht 5\' 2"  (1.575 m)  Wt 74.844 kg  BMI 30.17 kg/m2  SpO2 100%  LMP 10/17/2014   Physical Exam  Constitutional: She is oriented to person, place, and time. She appears well-developed and well-nourished. No distress.  HENT:  Head: Normocephalic and atraumatic.  Eyes: Conjunctivae and EOM are normal.  Neck: Neck supple. No tracheal deviation present.  Cardiovascular: Normal rate.   Pulmonary/Chest: Effort normal.  Musculoskeletal: Normal range of motion.       Left hand: She exhibits tenderness and laceration. She exhibits normal range of motion and normal capillary refill. Normal sensation noted. Normal strength noted.  Neurological: She is alert and oriented to person, place, and time.  Skin: Skin is warm and dry. Laceration noted.  2 cm laceration to palmar aspect of left thumb tip   Psychiatric: She has a normal mood and affect. Her behavior is normal.  Nursing note and vitals reviewed.   ED Course  Procedures (including critical care time)  LACERATION REPAIR PROCEDURE NOTE The patient's identification was confirmed and consent was obtained. This procedure was performed by Debroah Baller, NP at 5:01 PM. Site: left thumb Sterile procedures observed Anesthetic used (type and amt): 1.5 CCs 1% Lidocaine Suture type/size:5-0 Prolene Length: 2cm # of Sutures: 5 Technique:simple and interrupted Complexity simple Antibx ointment applied Tetanus UTD or ordered; ordered Site anesthetized, irrigated with NS, explored without evidence of foreign body, wound well approximated, site covered with dry, sterile dressing.  Patient tolerated procedure well without complications. Instructions for care discussed verbally and patient provided with additional written instructions for homecare and f/u.  DIAGNOSTIC STUDIES: Oxygen Saturation is 100% on RA, normal by my interpretation.    COORDINATION OF CARE: 4:37 PM-Discussed treatment plan which includes sutures to left thumb with pt at bedside and pt agreed to plan.    MDM   Final diagnoses:  Laceration of thumb, left, initial encounter   Tetanus updated in ED, Laceration occurred < 12 hours prior to repair. Discussed laceration care with pt and answered questions. Pt to f-u for suture removal in 7 days and wound check sooner should there be signs of dehiscence or infection. Pt is hemodynamically stable with no complaints prior to dc.    I personally performed the services described in this documentation, which was scribed in my presence. The recorded information has been reviewed and is accurate.   [place], NP 06/01/16 [place], MD 06/04/16 [phone]

## 2016-06-10 ENCOUNTER — Emergency Department (HOSPITAL_COMMUNITY)
Admission: EM | Admit: 2016-06-10 | Discharge: 2016-06-10 | Disposition: A | Discharge status: Home / Self Care | Payer: Medicaid Other | Attending: Emergency Medicine | Admitting: Emergency Medicine

## 2016-06-10 ENCOUNTER — Encounter (HOSPITAL_COMMUNITY): Payer: Self-pay | Admitting: Emergency Medicine

## 2016-06-10 DIAGNOSIS — Z4802 Encounter for removal of sutures: Secondary | ICD-10-CM | POA: Insufficient documentation

## 2016-06-10 DIAGNOSIS — Z79899 Other long term (current) drug therapy: Secondary | ICD-10-CM | POA: Diagnosis not present

## 2016-06-10 DIAGNOSIS — F17210 Nicotine dependence, cigarettes, uncomplicated: Secondary | ICD-10-CM | POA: Diagnosis not present

## 2016-06-10 NOTE — ED Notes (Signed)
Pt here for suture removal to left thumb.  

## 2016-06-10 NOTE — ED Notes (Signed)
Declined W/C at D/C and was escorted to lobby by RN. 

## 2016-06-10 NOTE — ED Provider Notes (Signed)
CSN: [id number]     Arrival date & time 06/10/16  1707 History  By signing my name below, I, Soijett Blue, attest that this documentation has been prepared under the direction and in the presence of Montine Circle, PA-C Electronically Signed: [place], ED Scribe. 06/10/2016. 5:40 PM.   Chief Complaint  Patient presents with  . Suture / Staple Removal      The history is provided by the patient. No language interpreter was used.    Anna Schroeder is a 35 y.o. female who presents to the Emergency Department complaining of suture removal onset today. Pt had the sutures placed to her left thumb due to cutting it on a metal meat slicer. Pt notes that she has mild soreness to the area. She states that she has not tried any medications for the relief for her symptoms. Denies drainage, fever, chills, and any other symptoms.   Per pt chart review: Pt was seen in the ED on 05/31/2016 for laceration to left thumb. Pt had 5 sutures placed to her left thumb and was instructed to follow up in 7 days for suture removal.    Past Medical History  Diagnosis Date  . Chlamydia   . Fibroids   . BV (bacterial vaginosis)   . Vaginal yeast infection   . Family history of anesthesia complication     parents were slow to wake up  . Pelvic mass    Past Surgical History  Procedure Laterality Date  . Tubal ligation    . Robotic assisted total hysterectomy N/A 10/29/2014    Procedure: ROBOTIC ASSISTED TOTAL HYSTERECTOMY, LEFT SALPINGOOOPHERECTOMY, RIGHT SALPINGECTOMY;  Surgeon: Lahoma Crocker, MD;  Location: [facility];  Service: Gynecology;  Laterality: N/A;   Family History  Problem Relation Age of Onset  . Hypertension Mother   . Heart disease Mother   . Arthritis Mother   . Alcohol abuse Father   . Arthritis Father   . Asthma Father   . Diabetes Father   . Drug abuse Father   . Heart disease Father   . Hyperlipidemia Father   . Hypertension Father   . Kidney disease Father   . Stroke Father     Social History  Substance Use Topics  . Smoking status: Current Some Day Smoker -- 0.50 packs/day    Types: Cigarettes  . Smokeless tobacco: None  . Alcohol Use: Yes     Comment: Occassionally   OB History    Gravida Para Term Preterm AB TAB SAB Ectopic Multiple Living   3 3 3       3      Review of Systems  Constitutional: Negative for fever and chills.  Skin: Negative for color change.       Sutures to left thumb without drainage.   All other systems reviewed and are negative.     Allergies  Review of patient's allergies indicates no known allergies.  Home Medications   Prior to Admission medications   Medication Sig Start Date End Date Taking? Authorizing Provider  acetaminophen (TYLENOL) 500 MG tablet Take 1 tablet (500 mg total) by mouth every 6 (six) hours as needed. 11/10/15   Gloriann Loan, PA-C  cyclobenzaprine (FLEXERIL) 10 MG tablet Take 1 tablet (10 mg total) by mouth every 8 (eight) hours as needed for muscle spasms. 05/03/16   Shelly Bombard, MD   BP 129/84 mmHg  Pulse 79  Temp(Src) 97.8 F (36.6 C) (Oral)  Resp 18  SpO2 100%  LMP  10/17/2014 Physical Exam  Physical Exam  Constitutional: Pt is oriented to person, place, and time. Appears well-developed and well-nourished. No distress.  HENT:  Head: Normocephalic and atraumatic.  Eyes: Conjunctivae are normal. No scleral icterus.  Neck: Normal range of motion.  Cardiovascular: Normal rate, regular rhythm, normal heart sounds and intact distal pulses.   No murmur heard. Capillary refill < 3 sec  Pulmonary/Chest: Effort normal and breath sounds normal. No respiratory distress.  Musculoskeletal: Normal range of motion. Exhibits no edema.  ROM: 5/ 5 Neurological: Pt is alert and oriented to person, place, and time.  Sensation: 5/5 Strength: 5/5  Skin: well healing laceration to left thumb. Skin is warm and dry. Pt. is not diaphoretic.  Psychiatric: Pt has a normal mood and affect.  Nursing note and  vitals reviewed.   ED Course  Procedures (including critical care time) DIAGNOSTIC STUDIES: Oxygen Saturation is 100% on RA, nl by my interpretation.    COORDINATION OF CARE: 5:38 PM Discussed treatment plan with pt at bedside which includes suture removal and pt agreed to plan.  SUTURE REMOVAL Performed by: Montine Circle  Consent: Verbal consent obtained. Consent given by: patient Required items: required blood products, implants, devices, and special equipment available Time out: Immediately prior to procedure a "time out" was called to verify the correct patient, procedure, equipment, support staff and site/side marked as required.  Location: Left thumb  Wound Appearance: clean  Sutures/Staples Removed: 5  Patient tolerance: Patient tolerated the procedure well with no immediate complications.     MDM   Final diagnoses:  Visit for suture removal    Suture removal   Pt to ER for staple/suture removal and wound check as above. Procedure tolerated well. Vitals normal, no signs of infection. Scar minimization & return precautions given at dc.   I personally performed the services described in this documentation, which was scribed in my presence. The recorded information has been reviewed and is accurate.      Montine Circle, PA-C 06/10/16 [place] Liu, MD 06/11/16 0040

## 2016-06-10 NOTE — Discharge Instructions (Signed)

## 2016-06-10 NOTE — ED Notes (Signed)
Sutures removed per PA Browning.

## 2016-08-20 ENCOUNTER — Telehealth: Payer: Self-pay | Admitting: *Deleted

## 2016-08-20 DIAGNOSIS — B373 Candidiasis of vulva and vagina: Secondary | ICD-10-CM

## 2016-08-20 DIAGNOSIS — B3731 Acute candidiasis of vulva and vagina: Secondary | ICD-10-CM

## 2016-08-20 MED ORDER — FLUCONAZOLE 150 MG PO TABS: 150.0000 mg | ORAL_TABLET | ORAL | 0 refills | Status: AC

## 2016-08-20 NOTE — Telephone Encounter (Signed)
Patient request treatment for yeast infection.  2/28 4:44 Call to patient- she is very irritated and would like treatment for yeast- patient advised to come to office if treatment does not help.

## 2016-09-18 ENCOUNTER — Encounter: Payer: Self-pay | Admitting: *Deleted

## 2017-02-01 ENCOUNTER — Encounter (HOSPITAL_COMMUNITY): Payer: Self-pay | Admitting: Emergency Medicine

## 2017-02-01 ENCOUNTER — Ambulatory Visit (HOSPITAL_COMMUNITY)
Admission: EM | Admit: 2017-02-01 | Discharge: 2017-02-01 | Disposition: A | Discharge status: Home / Self Care | Payer: Medicaid Other | Attending: Family Medicine | Admitting: Family Medicine

## 2017-02-01 DIAGNOSIS — Z87891 Personal history of nicotine dependence: Secondary | ICD-10-CM | POA: Insufficient documentation

## 2017-02-01 DIAGNOSIS — Z8261 Family history of arthritis: Secondary | ICD-10-CM | POA: Insufficient documentation

## 2017-02-01 DIAGNOSIS — Z8249 Family history of ischemic heart disease and other diseases of the circulatory system: Secondary | ICD-10-CM | POA: Insufficient documentation

## 2017-02-01 DIAGNOSIS — Z833 Family history of diabetes mellitus: Secondary | ICD-10-CM | POA: Insufficient documentation

## 2017-02-01 DIAGNOSIS — Z90710 Acquired absence of both cervix and uterus: Secondary | ICD-10-CM | POA: Insufficient documentation

## 2017-02-01 DIAGNOSIS — Z811 Family history of alcohol abuse and dependence: Secondary | ICD-10-CM | POA: Insufficient documentation

## 2017-02-01 DIAGNOSIS — Z79899 Other long term (current) drug therapy: Secondary | ICD-10-CM | POA: Insufficient documentation

## 2017-02-01 DIAGNOSIS — N730 Acute parametritis and pelvic cellulitis: Secondary | ICD-10-CM

## 2017-02-01 DIAGNOSIS — R109 Unspecified abdominal pain: Secondary | ICD-10-CM | POA: Insufficient documentation

## 2017-02-01 DIAGNOSIS — N898 Other specified noninflammatory disorders of vagina: Secondary | ICD-10-CM

## 2017-02-01 DIAGNOSIS — Z823 Family history of stroke: Secondary | ICD-10-CM | POA: Insufficient documentation

## 2017-02-01 LAB — POCT URINALYSIS DIP (DEVICE)
Bilirubin Urine: NEGATIVE
Glucose, UA: NEGATIVE mg/dL
Hgb urine dipstick: NEGATIVE
LEUKOCYTES UA: NEGATIVE
NITRITE: NEGATIVE
PH: 7.5 (ref 5.0–8.0)
Protein, ur: NEGATIVE mg/dL
Specific Gravity, Urine: 1.02 (ref 1.005–1.030)
UROBILINOGEN UA: 1 mg/dL (ref 0.0–1.0)

## 2017-02-01 LAB — POCT PREGNANCY, URINE: Preg Test, Ur: NEGATIVE

## 2017-02-01 MED ORDER — CEFTRIAXONE SODIUM 250 MG IJ SOLR
250.0000 mg | Freq: Once | INTRAMUSCULAR | Status: AC
  Administered 2017-02-01: 250 mg via INTRAMUSCULAR

## 2017-02-01 MED ORDER — METRONIDAZOLE 500 MG PO TABS: 500.0000 mg | ORAL_TABLET | Freq: Two times a day (BID) | ORAL | 0 refills | Status: DC

## 2017-02-01 MED ORDER — DOXYCYCLINE HYCLATE 100 MG PO CAPS: 100.0000 mg | ORAL_CAPSULE | Freq: Two times a day (BID) | ORAL | 0 refills | Status: DC

## 2017-02-01 MED ORDER — LIDOCAINE HCL (PF) 1 % IJ SOLN
INTRAMUSCULAR | Status: AC
  Filled 2017-02-01: qty 2

## 2017-02-01 MED ORDER — CEFTRIAXONE SODIUM 250 MG IJ SOLR
INTRAMUSCULAR | Status: AC
  Filled 2017-02-01: qty 250

## 2017-02-01 NOTE — ED Triage Notes (Signed)
Pt is having suprapubic pain with urination, urgency to urinate with little output and lower back pain.  She denies any fever.  She reports some discharge, but no odor.

## 2017-02-01 NOTE — ED Provider Notes (Signed)
CSN: [id number]     Arrival date & time 02/01/17  1704 History   First MD Initiated Contact with Patient 02/01/17 1748     Chief Complaint  Patient presents with  . Urinary Tract Infection   (Consider location/radiation/quality/duration/timing/severity/associated sxs/prior Treatment) Patient c/o suprapubic pain and urgency.  Patient c/o vaginal discharge.     The history is provided by the patient.  Urinary Tract Infection  Pain quality:  Aching Pain severity:  Moderate Onset quality:  Sudden Duration:  3 days Timing:  Constant Progression:  Worsening Chronicity:  New Recent urinary tract infections: no   Relieved by:  Nothing Worsened by:  Nothing Associated symptoms: abdominal pain and vaginal discharge     Past Medical History:  Diagnosis Date  . BV (bacterial vaginosis)   . Chlamydia   . Family history of anesthesia complication    parents were slow to wake up  . Fibroids   . Pelvic mass   . Vaginal yeast infection    Past Surgical History:  Procedure Laterality Date  . ROBOTIC ASSISTED TOTAL HYSTERECTOMY N/A 10/29/2014   Procedure: ROBOTIC ASSISTED TOTAL HYSTERECTOMY, LEFT SALPINGOOOPHERECTOMY, RIGHT SALPINGECTOMY;  Surgeon: Lahoma Crocker, MD;  Location: [facility];  Service: Gynecology;  Laterality: N/A;  . TUBAL LIGATION     Family History  Problem Relation Age of Onset  . Hypertension Mother   . Heart disease Mother   . Arthritis Mother   . Alcohol abuse Father   . Arthritis Father   . Asthma Father   . Diabetes Father   . Drug abuse Father   . Heart disease Father   . Hyperlipidemia Father   . Hypertension Father   . Kidney disease Father   . Stroke Father    Social History  Substance Use Topics  . Smoking status: Former Smoker    Packs/day: 0.50    Types: Cigarettes    Quit date: 10/15/2015  . Smokeless tobacco: Never Used  . Alcohol use Yes     Comment: Occassionally   OB History    Gravida Para Term Preterm AB Living   3 3 3     3     SAB TAB Ectopic Multiple Live Births           3     Review of Systems  Constitutional: Negative.   HENT: Negative.   Eyes: Negative.   Respiratory: Negative.   Cardiovascular: Negative.   Gastrointestinal: Positive for abdominal pain.  Endocrine: Negative.   Genitourinary: Positive for vaginal discharge.  Musculoskeletal: Negative.   Allergic/Immunologic: Negative.   Neurological: Negative.   Hematological: Negative.   Psychiatric/Behavioral: Negative.     Allergies  Patient has no known allergies.  Home Medications   Prior to Admission medications   Medication Sig Start Date End Date Taking? Authorizing Provider  acetaminophen (TYLENOL) 500 MG tablet Take 1 tablet (500 mg total) by mouth every 6 (six) hours as needed. 11/10/15   Gloriann Loan, PA-C  cyclobenzaprine (FLEXERIL) 10 MG tablet Take 1 tablet (10 mg total) by mouth every 8 (eight) hours as needed for muscle spasms. 05/03/16   Shelly Bombard, MD  doxycycline (VIBRAMYCIN) 100 MG capsule Take 1 capsule (100 mg total) by mouth 2 (two) times daily. 02/01/17   Lysbeth Penner, FNP  metroNIDAZOLE (FLAGYL) 500 MG tablet Take 1 tablet (500 mg total) by mouth 2 (two) times daily. 02/01/17   Lysbeth Penner, FNP   Meds Ordered and Administered this Visit   Medications  cefTRIAXone (ROCEPHIN) injection 250 mg (not administered)    BP 136/77 (BP Location: Left Arm)   Pulse 80   Temp 99 F (37.2 C) (Oral)   LMP 10/17/2014   SpO2 100%  No data found.   Physical Exam  Constitutional: She appears well-developed and well-nourished.  HENT:  Head: Normocephalic and atraumatic.  Eyes: Conjunctivae and EOM are normal. Pupils are equal, round, and reactive to light.  Neck: Normal range of motion. Neck supple.  Cardiovascular: Normal rate, regular rhythm and normal heart sounds.   Pulmonary/Chest: Effort normal and breath sounds normal.  Abdominal: Soft. Bowel sounds are normal.  Genitourinary: Vaginal discharge found.   Genitourinary Comments: BUS - Wnl Vagina - White/clear discharge Cervix - Cervical Motion Tenderness and adnexa with mild tenderness.  Nursing note and vitals reviewed.   Urgent Care Course     Procedures (including critical care time)  Labs Review Labs Reviewed  POCT URINALYSIS DIP (DEVICE) - Abnormal; Notable for the following:       Result Value   Ketones, ur TRACE (*)    All other components within normal limits  POCT PREGNANCY, URINE  CERVICOVAGINAL ANCILLARY ONLY    Imaging Review No results found.   Visual Acuity Review  Right Eye Distance:   Left Eye Distance:   Bilateral Distance:    Right Eye Near:   Left Eye Near:    Bilateral Near:         MDM   1. Vaginal discharge   2. PID (acute pelvic inflammatory disease)    Rocephin 250mg  IM Flagyl 500mg  one po bid x 14 days  Doxycycline 100mg  one po bid x 14 days  Endocervical Cytology - GC chlamydia BD affirm UA cx      Lysbeth Penner, FNP 02/01/17 1812

## 2017-02-03 LAB — URINE CULTURE

## 2017-02-04 LAB — CERVICOVAGINAL ANCILLARY ONLY
Chlamydia: NEGATIVE
Neisseria Gonorrhea: NEGATIVE
Wet Prep (BD Affirm): NEGATIVE

## 2017-03-12 ENCOUNTER — Other Ambulatory Visit: Payer: Self-pay | Admitting: *Deleted

## 2017-03-12 ENCOUNTER — Telehealth: Payer: Self-pay | Admitting: *Deleted

## 2017-03-12 DIAGNOSIS — T3695XA Adverse effect of unspecified systemic antibiotic, initial encounter: Principal | ICD-10-CM

## 2017-03-12 DIAGNOSIS — B379 Candidiasis, unspecified: Secondary | ICD-10-CM

## 2017-03-12 MED ORDER — FLUCONAZOLE 150 MG PO TABS: 150.0000 mg | ORAL_TABLET | ORAL | 0 refills | Status: DC

## 2017-03-12 NOTE — Telephone Encounter (Signed)
LM on VM to CB 9:50 Attempted to call back- left message

## 2017-03-12 NOTE — Telephone Encounter (Signed)
Patient is calling about symptoms of a yeast infection. She was treated for PID with antibiotics and she has a clumpy white discharge with itching now. Patient treated per protocol.

## 2017-05-07 ENCOUNTER — Encounter: Payer: Self-pay | Admitting: Obstetrics

## 2017-05-07 ENCOUNTER — Ambulatory Visit (INDEPENDENT_AMBULATORY_CARE_PROVIDER_SITE_OTHER): Payer: Medicaid Other | Admitting: Obstetrics

## 2017-05-07 VITALS — BP 124/86 | HR 64 | Ht 62.0 in | Wt 155.9 lb

## 2017-05-07 DIAGNOSIS — Z01419 Encounter for gynecological examination (general) (routine) without abnormal findings: Secondary | ICD-10-CM

## 2017-05-07 DIAGNOSIS — Z308 Encounter for other contraceptive management: Secondary | ICD-10-CM | POA: Diagnosis not present

## 2017-05-07 DIAGNOSIS — M545 Low back pain, unspecified: Secondary | ICD-10-CM

## 2017-05-07 DIAGNOSIS — G8929 Other chronic pain: Secondary | ICD-10-CM

## 2017-05-07 MED ORDER — CYCLOBENZAPRINE HCL 10 MG PO TABS: 10.0000 mg | ORAL_TABLET | Freq: Three times a day (TID) | ORAL | 2 refills | Status: DC | PRN

## 2017-05-07 MED ORDER — IBUPROFEN 800 MG PO TABS: 800.0000 mg | ORAL_TABLET | Freq: Three times a day (TID) | ORAL | 5 refills | Status: DC | PRN

## 2017-05-07 NOTE — Progress Notes (Signed)
Subjective:        Anna Schroeder is a 36 y.o. female here for a routine exam.  Current complaints: Low back spasm.    Personal health questionnaire:  Is patient Ashkenazi Jewish, have a family history of breast and/or ovarian cancer: no Is there a family history of uterine cancer diagnosed at age < 80, gastrointestinal cancer, urinary tract cancer, family member who is a Field seismologist syndrome-associated carrier: no Is the patient overweight and hypertensive, family history of diabetes, personal history of gestational diabetes, preeclampsia or PCOS: no Is patient over 49, have PCOS,  family history of premature CHD under age 55, diabetes, smoke, have hypertension or peripheral artery disease:  no At any time, has a partner hit, kicked or otherwise hurt or frightened you?: no Over the past 2 weeks, have you felt down, depressed or hopeless?: no Over the past 2 weeks, have you felt little interest or pleasure in doing things?:no   Gynecologic History Patient's last menstrual period was 10/17/2014. Contraception: status post hysterectomy Last Pap: 2015. Results were: normal Last mammogram: n/a. Results were: n/a  Obstetric History OB History  Gravida Para Term Preterm AB Living  3 3 3     3   SAB TAB Ectopic Multiple Live Births          3    # Outcome Date GA Lbr Len/2nd Weight Sex Delivery Anes PTL Lv  3 Term      Vag-Spont   LIV  2 Term      CS-LTranv   LIV  1 Term      Vag-Spont   LIV      Past Medical History:  Diagnosis Date  . BV (bacterial vaginosis)   . Chlamydia   . Family history of anesthesia complication    parents were slow to wake up  . Fibroids   . Pelvic mass   . Vaginal yeast infection     Past Surgical History:  Procedure Laterality Date  . ROBOTIC ASSISTED TOTAL HYSTERECTOMY N/A 10/29/2014   Procedure: ROBOTIC ASSISTED TOTAL HYSTERECTOMY, LEFT SALPINGOOOPHERECTOMY, RIGHT SALPINGECTOMY;  Surgeon: Lahoma Crocker, MD;  Location: [facility];  Service:  Gynecology;  Laterality: N/A;  . TUBAL LIGATION       Current Outpatient Prescriptions:  .  acetaminophen (TYLENOL) 500 MG tablet, Take 1 tablet (500 mg total) by mouth every 6 (six) hours as needed. (Patient not taking: Reported on 05/07/2017), Disp: 30 tablet, Rfl: 0 .  cyclobenzaprine (FLEXERIL) 10 MG tablet, Take 1 tablet (10 mg total) by mouth every 8 (eight) hours as needed for muscle spasms., Disp: 30 tablet, Rfl: 2 .  ibuprofen (ADVIL,MOTRIN) 800 MG tablet, Take 1 tablet (800 mg total) by mouth every 8 (eight) hours as needed., Disp: 30 tablet, Rfl: 5 No Known Allergies  Social History  Substance Use Topics  . Smoking status: Former Smoker    Packs/day: 0.50    Types: Cigarettes    Quit date: 10/15/2015  . Smokeless tobacco: Never Used  . Alcohol use Yes     Comment: Occassionally    Family History  Problem Relation Age of Onset  . Hypertension Mother   . Heart disease Mother   . Arthritis Mother   . Alcohol abuse Father   . Arthritis Father   . Asthma Father   . Diabetes Father   . Drug abuse Father   . Heart disease Father   . Hyperlipidemia Father   . Hypertension Father   . Kidney disease Father   .  Stroke Father       Review of Systems  Constitutional: negative for fatigue and weight loss Respiratory: negative for cough and wheezing Cardiovascular: negative for chest pain, fatigue and palpitations Gastrointestinal: negative for abdominal pain and change in bowel habits Musculoskeletal:negative for myalgias Neurological: negative for gait problems and tremors Behavioral/Psych: negative for abusive relationship, depression Endocrine: negative for temperature intolerance    Genitourinary:negative for abnormal menstrual periods, genital lesions, hot flashes, sexual problems and vaginal discharge Integument/breast: negative for breast lump, breast tenderness, nipple discharge and skin lesion(s)    Objective:       BP 124/86   Pulse 64   Ht 5\' 2"  (1.575  m)   Wt 155 lb 14.4 oz (70.7 kg)   LMP 10/17/2014   BMI 28.51 kg/m  General:   alert  Skin:   no rash or abnormalities  Lungs:   clear to auscultation bilaterally  Heart:   regular rate and rhythm, S1, S2 normal, no murmur, click, rub or gallop  Breasts:   normal without suspicious masses, skin or nipple changes or axillary nodes  Abdomen:  normal findings: no organomegaly, soft, non-tender and no hernia  Pelvis:  External genitalia: normal general appearance Urinary system: urethral meatus normal and bladder without fullness, nontender Vaginal: normal without tenderness, induration or masses Cervix: Absent Adnexa: normal bimanual exam Uterus: Absent   Lab Review Urine pregnancy test Labs reviewed yes Radiologic studies reviewed no  50% of 20 min visit spent on counseling and coordination of care.    Assessment:    Healthy female exam.    Low Backache   Plan:   Flexeril / Ibuprofen Rx   Education reviewed: calcium supplements, depression evaluation, low fat, low cholesterol diet, safe sex/STD prevention, self breast exams and weight bearing exercise. Follow up in: 1 year.   Meds ordered this encounter  Medications  . cyclobenzaprine (FLEXERIL) 10 MG tablet    Sig: Take 1 tablet (10 mg total) by mouth every 8 (eight) hours as needed for muscle spasms.    Dispense:  30 tablet    Refill:  2  . ibuprofen (ADVIL,MOTRIN) 800 MG tablet    Sig: Take 1 tablet (800 mg total) by mouth every 8 (eight) hours as needed.    Dispense:  30 tablet    Refill:  5   No orders of the defined types were placed in this encounter.

## 2017-05-07 NOTE — Progress Notes (Signed)
Pt presents for annual. Requests all STD bloodwork. Normal pap 10/28/14.

## 2017-09-11 ENCOUNTER — Encounter (HOSPITAL_COMMUNITY): Payer: Self-pay | Admitting: Emergency Medicine

## 2017-09-11 DIAGNOSIS — N760 Acute vaginitis: Secondary | ICD-10-CM | POA: Insufficient documentation

## 2017-09-11 DIAGNOSIS — Z87891 Personal history of nicotine dependence: Secondary | ICD-10-CM | POA: Insufficient documentation

## 2017-09-11 LAB — CBC WITH DIFFERENTIAL/PLATELET
BASOS PCT: 0 %
Basophils Absolute: 0 10*3/uL (ref 0.0–0.1)
Eosinophils Absolute: 0.1 10*3/uL (ref 0.0–0.7)
Eosinophils Relative: 1 %
HEMATOCRIT: 36.9 % (ref 36.0–46.0)
HEMOGLOBIN: 13.2 g/dL (ref 12.0–15.0)
LYMPHS ABS: 4.7 10*3/uL — AB (ref 0.7–4.0)
LYMPHS PCT: 42 %
MCH: 29.7 pg (ref 26.0–34.0)
MCHC: 35.8 g/dL (ref 30.0–36.0)
MCV: 83.1 fL (ref 78.0–100.0)
MONO ABS: 0.5 10*3/uL (ref 0.1–1.0)
MONOS PCT: 5 %
NEUTROS ABS: 5.8 10*3/uL (ref 1.7–7.7)
NEUTROS PCT: 52 %
Platelets: 194 10*3/uL (ref 150–400)
RBC: 4.44 MIL/uL (ref 3.87–5.11)
RDW: 13.2 % (ref 11.5–15.5)
WBC: 11.2 10*3/uL — ABNORMAL HIGH (ref 4.0–10.5)

## 2017-09-11 LAB — URINALYSIS, ROUTINE W REFLEX MICROSCOPIC
Bilirubin Urine: NEGATIVE
GLUCOSE, UA: NEGATIVE mg/dL
Hgb urine dipstick: NEGATIVE
KETONES UR: 5 mg/dL — AB
LEUKOCYTES UA: NEGATIVE
NITRITE: NEGATIVE
Protein, ur: NEGATIVE mg/dL
SPECIFIC GRAVITY, URINE: 1.023 (ref 1.005–1.030)
pH: 5 (ref 5.0–8.0)

## 2017-09-11 NOTE — ED Triage Notes (Signed)
Patient reports whitish vaginal discharge with mild itching/irritation , low abdominal cramping/low back pain onset last week , no fever or chills .

## 2017-09-12 ENCOUNTER — Emergency Department (HOSPITAL_COMMUNITY)
Admission: EM | Admit: 2017-09-12 | Discharge: 2017-09-12 | Disposition: A | Discharge status: Home / Self Care | Payer: Self-pay | Attending: Emergency Medicine | Admitting: Emergency Medicine

## 2017-09-12 DIAGNOSIS — N760 Acute vaginitis: Secondary | ICD-10-CM

## 2017-09-12 DIAGNOSIS — N898 Other specified noninflammatory disorders of vagina: Secondary | ICD-10-CM

## 2017-09-12 DIAGNOSIS — B9689 Other specified bacterial agents as the cause of diseases classified elsewhere: Secondary | ICD-10-CM

## 2017-09-12 LAB — COMPREHENSIVE METABOLIC PANEL
ALK PHOS: 39 U/L (ref 38–126)
ALT: 19 U/L (ref 14–54)
ANION GAP: 6 (ref 5–15)
AST: 22 U/L (ref 15–41)
Albumin: 4.1 g/dL (ref 3.5–5.0)
BILIRUBIN TOTAL: 1.1 mg/dL (ref 0.3–1.2)
BUN: 12 mg/dL (ref 6–20)
CALCIUM: 9.2 mg/dL (ref 8.9–10.3)
CO2: 22 mmol/L (ref 22–32)
Chloride: 107 mmol/L (ref 101–111)
Creatinine, Ser: 0.92 mg/dL (ref 0.44–1.00)
GFR calc Af Amer: >60 mL/min (ref >60)
GLUCOSE: 93 mg/dL (ref 65–99)
POTASSIUM: 4 mmol/L (ref 3.5–5.1)
Sodium: 135 mmol/L (ref 135–145)
TOTAL PROTEIN: 7.7 g/dL (ref 6.5–8.1)

## 2017-09-12 LAB — WET PREP, GENITAL
Sperm: NONE SEEN
Trich, Wet Prep: NONE SEEN
YEAST WET PREP: NONE SEEN

## 2017-09-12 LAB — GC/CHLAMYDIA PROBE AMP (~~LOC~~) NOT AT ARMC
Chlamydia: NEGATIVE
NEISSERIA GONORRHEA: NEGATIVE

## 2017-09-12 MED ORDER — METRONIDAZOLE 500 MG PO TABS: 500.0000 mg | ORAL_TABLET | Freq: Two times a day (BID) | ORAL | 0 refills | Status: DC

## 2017-09-12 NOTE — ED Provider Notes (Signed)
[place] DEPT Provider Note   CSN: [id number] Arrival date & time: 09/11/17  2158     History   Chief Complaint Chief Complaint  Patient presents with  . Vaginal Discharge    HPI Anna Schroeder is a 36 y.o. female.  Patient presents to the emergency department with chief complaint of vaginal discharge. She states that she knows symptoms last week. She reports some lower abdominal cramps.She denies any dysuria. Denies any fevers chills. She has a history of partial hysterectomy including the removal of her uterus and left ovary. She denies any new sexual contacts. She denies any upper abdominal pain. Denies any other associated symptoms.   The history is provided by the patient. No language interpreter was used.    Past Medical History:  Diagnosis Date  . BV (bacterial vaginosis)   . Chlamydia   . Family history of anesthesia complication    parents were slow to wake up  . Fibroids   . Pelvic mass   . Vaginal yeast infection     Patient Active Problem List   Diagnosis Date Noted  . Endometriosis determined by laparoscopy 10/30/2014  . Pelvic pain in female 09/27/2014  . OBESITY, NOS 02/20/2007    Past Surgical History:  Procedure Laterality Date  . ROBOTIC ASSISTED TOTAL HYSTERECTOMY N/A 10/29/2014   Procedure: ROBOTIC ASSISTED TOTAL HYSTERECTOMY, LEFT SALPINGOOOPHERECTOMY, RIGHT SALPINGECTOMY;  Surgeon: Lahoma Crocker, MD;  Location: [facility];  Service: Gynecology;  Laterality: N/A;  . TUBAL LIGATION      OB History    Gravida Para Term Preterm AB Living   3 3 3     3    SAB TAB Ectopic Multiple Live Births           3       Home Medications    Prior to Admission medications   Medication Sig Start Date End Date Taking? Authorizing Provider  acetaminophen (TYLENOL) 500 MG tablet Take 1 tablet (500 mg total) by mouth every 6 (six) hours as needed. Patient not taking: Reported on 05/07/2017 11/10/15   Gloriann Loan, PA-C  cyclobenzaprine (FLEXERIL) 10 MG  tablet Take 1 tablet (10 mg total) by mouth every 8 (eight) hours as needed for muscle spasms. 05/07/17   Shelly Bombard, MD  ibuprofen (ADVIL,MOTRIN) 800 MG tablet Take 1 tablet (800 mg total) by mouth every 8 (eight) hours as needed. 05/07/17   Shelly Bombard, MD  metroNIDAZOLE (FLAGYL) 500 MG tablet Take 1 tablet (500 mg total) by mouth 2 (two) times daily. 09/12/17   Montine Circle, PA-C    Family History Family History  Problem Relation Age of Onset  . Hypertension Mother   . Heart disease Mother   . Arthritis Mother   . Alcohol abuse Father   . Arthritis Father   . Asthma Father   . Diabetes Father   . Drug abuse Father   . Heart disease Father   . Hyperlipidemia Father   . Hypertension Father   . Kidney disease Father   . Stroke Father     Social History Social History  Substance Use Topics  . Smoking status: Former Smoker    Packs/day: 0.50    Types: Cigarettes    Quit date: 10/15/2015  . Smokeless tobacco: Never Used  . Alcohol use Yes     Comment: Occassionally     Allergies   Patient has no known allergies.   Review of Systems Review of Systems  All other systems reviewed and  are negative.    Physical Exam Updated Vital Signs BP (!) 141/91 (BP Location: Left Arm)   Pulse (!) 101   Temp 98.5 F (36.9 C) (Oral)   Resp 18   LMP 10/17/2014   SpO2 100%   Physical Exam  Constitutional: She is oriented to person, place, and time. She appears well-developed and well-nourished.  HENT:  Head: Normocephalic and atraumatic.  Eyes: Pupils are equal, round, and reactive to light. Conjunctivae and EOM are normal.  Neck: Normal range of motion. Neck supple.  Cardiovascular: Normal rate and regular rhythm.  Exam reveals no gallop and no friction rub.   No murmur heard. Pulmonary/Chest: Effort normal and breath sounds normal. No respiratory distress. She has no wheezes. She has no rales. She exhibits no tenderness.  Abdominal: Soft. Bowel sounds are  normal. She exhibits no distension and no mass. There is no tenderness. There is no rebound and no guarding.  Genitourinary:  Genitourinary Comments: Pelvic exam chaperoned by female ER tech, no right or left adnexal tenderness, no uterine tenderness, mild vaginal discharge, no bleeding, no CMT or friability, no foreign body, no injury to the external genitalia, no other significant findings   Musculoskeletal: Normal range of motion. She exhibits no edema or tenderness.  Neurological: She is alert and oriented to person, place, and time.  Skin: Skin is warm and dry.  Psychiatric: She has a normal mood and affect. Her behavior is normal. Judgment and thought content normal.  Nursing note and vitals reviewed.    ED Treatments / Results  Labs (all labs ordered are listed, but only abnormal results are displayed) Labs Reviewed  WET PREP, GENITAL - Abnormal; Notable for the following:       Result Value   Clue Cells Wet Prep HPF POC PRESENT (*)    WBC, Wet Prep HPF POC MODERATE (*)    All other components within normal limits  URINALYSIS, ROUTINE W REFLEX MICROSCOPIC - Abnormal; Notable for the following:    Ketones, ur 5 (*)    All other components within normal limits  CBC WITH DIFFERENTIAL/PLATELET - Abnormal; Notable for the following:    WBC 11.2 (*)    Lymphs Abs 4.7 (*)    All other components within normal limits  COMPREHENSIVE METABOLIC PANEL  GC/CHLAMYDIA PROBE AMP ([place]) NOT AT [facility]    EKG  EKG Interpretation None       Radiology No results found.  Procedures Procedures (including critical care time)  Medications Ordered in ED Medications - No data to display   Initial Impression / Assessment and Plan / ED Course  I have reviewed the triage vital signs and the nursing notes.  Pertinent labs & imaging results that were available during my care of the patient were reviewed by me and considered in my medical decision making (see chart for details).      Patient with vaginal discharge. Clue cells seen on wet prep. Will treat for BV. Recommend follow-up with OB/GYN. Patient understands agrees the plan. She is stable and ready for discharge.  Final Clinical Impressions(s) / ED Diagnoses   Final diagnoses:  BV (bacterial vaginosis)  Vaginal discharge    New Prescriptions New Prescriptions   METRONIDAZOLE (FLAGYL) 500 MG TABLET    Take 1 tablet (500 mg total) by mouth 2 (two) times daily.     Montine Circle, PA-C 09/12/17 0354    Ripley Fraise, MD 09/12/17 [phone]

## 2017-10-15 ENCOUNTER — Encounter (HOSPITAL_COMMUNITY): Payer: Self-pay | Admitting: Emergency Medicine

## 2017-10-15 DIAGNOSIS — Z87891 Personal history of nicotine dependence: Secondary | ICD-10-CM | POA: Insufficient documentation

## 2017-10-15 DIAGNOSIS — B349 Viral infection, unspecified: Secondary | ICD-10-CM | POA: Insufficient documentation

## 2017-10-15 NOTE — ED Triage Notes (Signed)
Pt states Friday she started having an itchy throat,  Saturday a cough started, Sunday and Monday she had a headache where her head felt heavy   Today she has had 3 loose stools and she noticed a white patch on her throat  Pt states she has been able to eat very little but has been drinking well

## 2017-10-16 ENCOUNTER — Encounter (HOSPITAL_COMMUNITY): Payer: Self-pay | Admitting: Emergency Medicine

## 2017-10-16 ENCOUNTER — Emergency Department (HOSPITAL_COMMUNITY)
Admission: EM | Admit: 2017-10-16 | Discharge: 2017-10-16 | Disposition: A | Discharge status: Home / Self Care | Payer: Self-pay | Attending: Emergency Medicine | Admitting: Emergency Medicine

## 2017-10-16 DIAGNOSIS — B349 Viral infection, unspecified: Secondary | ICD-10-CM

## 2017-10-16 MED ORDER — LORATADINE 10 MG PO TABS: 10.0000 mg | ORAL_TABLET | Freq: Every day | ORAL | 0 refills | Status: DC

## 2017-10-16 MED ORDER — LORATADINE 10 MG PO TABS
10.0000 mg | ORAL_TABLET | Freq: Once | ORAL | Status: AC
  Administered 2017-10-16: 10 mg via ORAL
  Filled 2017-10-16: qty 1

## 2017-10-16 MED ORDER — IBUPROFEN 800 MG PO TABS
800.0000 mg | ORAL_TABLET | Freq: Once | ORAL | Status: AC
  Administered 2017-10-16: 800 mg via ORAL
  Filled 2017-10-16: qty 1

## 2017-10-16 MED ORDER — FLUTICASONE PROPIONATE 50 MCG/ACT NA SUSP: 2.0000 | Freq: Every day | NASAL | 0 refills | Status: DC

## 2017-10-16 NOTE — ED Provider Notes (Signed)
[place] DEPT Provider Note   CSN: [id number] Arrival date & time: 10/15/17  2126     History   Chief Complaint Chief Complaint  Patient presents with  . flu like symptoms    HPI Anna Schroeder is a 36 y.o. female.  The history is provided by the patient.  URI   This is a new problem. The current episode started more than 2 days ago. The problem has not changed since onset.There has been no fever. Associated symptoms include diarrhea, congestion and rhinorrhea. Pertinent negatives include no abdominal pain, no vomiting, no ear pain, no joint pain, no neck pain, no rash and no wheezing. She has tried nothing for the symptoms. The treatment provided no relief.    Past Medical History:  Diagnosis Date  . BV (bacterial vaginosis)   . Chlamydia   . Family history of anesthesia complication    parents were slow to wake up  . Fibroids   . Pelvic mass   . Vaginal yeast infection     Patient Active Problem List   Diagnosis Date Noted  . Endometriosis determined by laparoscopy 10/30/2014  . Pelvic pain in female 09/27/2014  . OBESITY, NOS 02/20/2007    Past Surgical History:  Procedure Laterality Date  . CESAREAN SECTION    . ROBOTIC ASSISTED TOTAL HYSTERECTOMY N/A 10/29/2014   Procedure: ROBOTIC ASSISTED TOTAL HYSTERECTOMY, LEFT SALPINGOOOPHERECTOMY, RIGHT SALPINGECTOMY;  Surgeon: Lahoma Crocker, MD;  Location: [facility];  Service: Gynecology;  Laterality: N/A;  . TUBAL LIGATION      OB History    Gravida Para Term Preterm AB Living   3 3 3     3    SAB TAB Ectopic Multiple Live Births           3       Home Medications    Prior to Admission medications   Medication Sig Start Date End Date Taking? Authorizing Provider  acetaminophen (TYLENOL) 500 MG tablet Take 1 tablet (500 mg total) by mouth every 6 (six) hours as needed. Patient not taking: Reported on 05/07/2017 11/10/15   Gloriann Loan, PA-C  cyclobenzaprine (FLEXERIL) 10 MG  tablet Take 1 tablet (10 mg total) by mouth every 8 (eight) hours as needed for muscle spasms. 05/07/17   Shelly Bombard, MD  ibuprofen (ADVIL,MOTRIN) 800 MG tablet Take 1 tablet (800 mg total) by mouth every 8 (eight) hours as needed. 05/07/17   Shelly Bombard, MD  metroNIDAZOLE (FLAGYL) 500 MG tablet Take 1 tablet (500 mg total) by mouth 2 (two) times daily. 09/12/17   Montine Circle, PA-C    Family History Family History  Problem Relation Age of Onset  . Hypertension Mother   . Heart disease Mother   . Arthritis Mother   . Alcohol abuse Father   . Arthritis Father   . Asthma Father   . Diabetes Father   . Drug abuse Father   . Heart disease Father   . Hyperlipidemia Father   . Hypertension Father   . Kidney disease Father   . Stroke Father     Social History Social History  Substance Use Topics  . Smoking status: Former Smoker    Packs/day: 0.50    Types: Cigarettes    Quit date: 10/15/2015  . Smokeless tobacco: Never Used  . Alcohol use Yes     Comment: rare     Allergies   Patient has no known allergies.   Review of Systems Review of  Systems  HENT: Positive for congestion and rhinorrhea. Negative for ear pain.   Respiratory: Negative for wheezing.   Gastrointestinal: Positive for diarrhea. Negative for abdominal pain and vomiting.  Musculoskeletal: Negative for joint pain and neck pain.  Skin: Negative for rash.  All other systems reviewed and are negative.    Physical Exam Updated Vital Signs BP (!) 136/94 (BP Location: Right Arm)   Pulse 90   Temp 98.1 F (36.7 C) (Oral)   Resp 18   Ht 5\' 2"  (1.575 m)   Wt 62.6 kg (138 lb)   LMP 10/17/2014   SpO2 100%   BMI 25.24 kg/m   Physical Exam  Constitutional: She is oriented to person, place, and time. She appears well-developed and well-nourished. No distress.  HENT:  Head: Normocephalic and atraumatic.  Mouth/Throat: No oropharyngeal exudate.  Eyes: Pupils are equal, round, and reactive to  light. Conjunctivae are normal.  Neck: Normal range of motion. Neck supple.  Cardiovascular: Normal rate, regular rhythm, normal heart sounds and intact distal pulses.   Pulmonary/Chest: Effort normal and breath sounds normal. She has no wheezes. She has no rales.  Abdominal: Soft. Bowel sounds are normal. She exhibits no mass. There is no tenderness. There is no rebound and no guarding.  Musculoskeletal: Normal range of motion.  Neurological: She is alert and oriented to person, place, and time.  Skin: Skin is warm and dry. Capillary refill takes less than 2 seconds.  Psychiatric: She has a normal mood and affect.     ED Treatments / Results   Vitals:   10/15/17 2213  BP: (!) 136/94  Pulse: 90  Resp: 18  Temp: 98.1 F (36.7 C)  SpO2: 100%    Procedures Procedures (including critical care time)  Medications Ordered in ED Medications  loratadine (CLARITIN) tablet 10 mg (not administered)  ibuprofen (ADVIL,MOTRIN) tablet 800 mg (not administered)    Based on the CENTOR criteria there is no indication for further testing or treatment    Final Clinical Impressions(s) / ED Diagnoses   Strict return precautions given for  Shortness of breath, swelling or the lips or tongue, chest pain, dyspnea on exertion, new weakness or numbness changes in vision or speech,  Inability to tolerate liquids or food, changes in voice cough, altered mental status or any concerns. No signs of systemic illness or infection. The patient is nontoxic-appearing on exam and vital signs are within normal limits.    I have reviewed the triage vital signs and the nursing notes. Pertinent labs &imaging results that were available during my care of the patient were reviewed by me and considered in my medical decision making (see chart for details).  After history, exam, and medical workup I feel the patient has been appropriately medically screened and is safe for discharge home. Pertinent diagnoses were  discussed with the patient. Patient was given return precautions.    Hunter Bachar, MD 10/16/17 [phone]

## 2017-11-27 ENCOUNTER — Emergency Department (HOSPITAL_COMMUNITY): Payer: Self-pay

## 2017-11-27 ENCOUNTER — Other Ambulatory Visit: Payer: Self-pay

## 2017-11-27 ENCOUNTER — Encounter (HOSPITAL_COMMUNITY): Payer: Self-pay

## 2017-11-27 ENCOUNTER — Emergency Department (HOSPITAL_COMMUNITY)
Admission: EM | Admit: 2017-11-27 | Discharge: 2017-11-27 | Disposition: A | Discharge status: Home / Self Care | Payer: Self-pay | Attending: Emergency Medicine | Admitting: Emergency Medicine

## 2017-11-27 DIAGNOSIS — Y999 Unspecified external cause status: Secondary | ICD-10-CM | POA: Insufficient documentation

## 2017-11-27 DIAGNOSIS — R079 Chest pain, unspecified: Secondary | ICD-10-CM

## 2017-11-27 DIAGNOSIS — R0789 Other chest pain: Secondary | ICD-10-CM

## 2017-11-27 DIAGNOSIS — X500XXA Overexertion from strenuous movement or load, initial encounter: Secondary | ICD-10-CM | POA: Insufficient documentation

## 2017-11-27 DIAGNOSIS — M7712 Lateral epicondylitis, left elbow: Secondary | ICD-10-CM | POA: Insufficient documentation

## 2017-11-27 DIAGNOSIS — Y929 Unspecified place or not applicable: Secondary | ICD-10-CM | POA: Insufficient documentation

## 2017-11-27 DIAGNOSIS — S29011A Strain of muscle and tendon of front wall of thorax, initial encounter: Secondary | ICD-10-CM | POA: Insufficient documentation

## 2017-11-27 DIAGNOSIS — Y9389 Activity, other specified: Secondary | ICD-10-CM | POA: Insufficient documentation

## 2017-11-27 DIAGNOSIS — T148XXA Other injury of unspecified body region, initial encounter: Secondary | ICD-10-CM

## 2017-11-27 DIAGNOSIS — Z87891 Personal history of nicotine dependence: Secondary | ICD-10-CM | POA: Insufficient documentation

## 2017-11-27 LAB — CBC
HCT: 37.1 % (ref 36.0–46.0)
HEMOGLOBIN: 13.5 g/dL (ref 12.0–15.0)
MCH: 30.6 pg (ref 26.0–34.0)
MCHC: 36.4 g/dL — AB (ref 30.0–36.0)
MCV: 84.1 fL (ref 78.0–100.0)
Platelets: 185 10*3/uL (ref 150–400)
RBC: 4.41 MIL/uL (ref 3.87–5.11)
RDW: 12.9 % (ref 11.5–15.5)
WBC: 8.5 10*3/uL (ref 4.0–10.5)

## 2017-11-27 LAB — BASIC METABOLIC PANEL
ANION GAP: 5 (ref 5–15)
BUN: 13 mg/dL (ref 6–20)
CALCIUM: 9.4 mg/dL (ref 8.9–10.3)
CO2: 26 mmol/L (ref 22–32)
Chloride: 104 mmol/L (ref 101–111)
Creatinine, Ser: 0.72 mg/dL (ref 0.44–1.00)
GFR calc Af Amer: >60 mL/min (ref >60)
GFR calc non Af Amer: >60 mL/min (ref >60)
GLUCOSE: 95 mg/dL (ref 65–99)
Potassium: 3.8 mmol/L (ref 3.5–5.1)
Sodium: 135 mmol/L (ref 135–145)

## 2017-11-27 LAB — I-STAT TROPONIN, ED
TROPONIN I, POC: 0 ng/mL (ref 0.00–0.08)
TROPONIN I, POC: 0 ng/mL (ref 0.00–0.08)

## 2017-11-27 LAB — I-STAT BETA HCG BLOOD, ED (MC, WL, AP ONLY): I-stat hCG, quantitative: <5 m[IU]/mL (ref <5)

## 2017-11-27 NOTE — ED Triage Notes (Signed)
Pt c/o centralized chest pain that started yesterday. She states that it went away last night, but came back today and has not subsided. Also c/o L arm pain with movement. 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 A&Ox4. No N/V/SOB at this time. She endorses some SOB yesterday.

## 2017-11-27 NOTE — ED Provider Notes (Signed)
[facility] DEPT Provider Note   CSN: [id number] Arrival date & time: 11/27/17  1602     History   Chief Complaint Chief Complaint  Patient presents with  . Chest Pain    HPI Anna Schroeder is a 36 y.o. female.  HPI  Chest pains Hurt left hand and it radiates to left arm, forearm area, notices it when lifting, hurts on lateral side and elbow-that was hurt months ago, worse with extending wrist, has been a few months of worsening.   Works as Tourist information centre manager at [employer], [employer], felt ok at work and when relaxing felt pain in middle of chest on both sides.  Thought it would maybe go away if she relaxed but it continued, then improved on way to ED.  Started around 3PM then continued for about 30-40 minutes.  No nausea or vomiting.  Today has not had any dyspnea.  Yesterday did not have chest pain, but did feel slight dyspnea on way to grocery strore.  NO syncope. Felt lightheaded when going from sitting to standing for xr  Thinks dad had heart issues, not sure Not smoking any more  Past Medical History:  Diagnosis Date  . BV (bacterial vaginosis)   . Chlamydia   . Family history of anesthesia complication    parents were slow to wake up  . Fibroids   . Pelvic mass   . Vaginal yeast infection     Patient Active Problem List   Diagnosis Date Noted  . Endometriosis determined by laparoscopy 10/30/2014  . Pelvic pain in female 09/27/2014  . OBESITY, NOS 02/20/2007    Past Surgical History:  Procedure Laterality Date  . CESAREAN SECTION    . ROBOTIC ASSISTED TOTAL HYSTERECTOMY N/A 10/29/2014   Procedure: ROBOTIC ASSISTED TOTAL HYSTERECTOMY, LEFT SALPINGOOOPHERECTOMY, RIGHT SALPINGECTOMY;  Surgeon: Lahoma Crocker, MD;  Location: [facility];  Service: Gynecology;  Laterality: N/A;  . TUBAL LIGATION      OB History    Gravida Para Term Preterm AB Living   3 3 3     3    SAB TAB Ectopic Multiple Live Births           3       Home  Medications    Prior to Admission medications   Medication Sig Start Date End Date Taking? Authorizing Provider  cyclobenzaprine (FLEXERIL) 10 MG tablet Take 1 tablet (10 mg total) by mouth every 8 (eight) hours as needed for muscle spasms. 05/07/17  Yes Shelly Bombard, MD  ibuprofen (ADVIL,MOTRIN) 800 MG tablet Take 1 tablet (800 mg total) by mouth every 8 (eight) hours as needed. Patient taking differently: Take 800 mg by mouth every 8 (eight) hours as needed for moderate pain.  05/07/17  Yes Shelly Bombard, MD  acetaminophen (TYLENOL) 500 MG tablet Take 1 tablet (500 mg total) by mouth every 6 (six) hours as needed. Patient not taking: Reported on 05/07/2017 11/10/15   Gloriann Loan, PA-C  fluticasone [facility]) 50 MCG/ACT nasal spray Place 2 sprays into both nostrils daily. Patient not taking: Reported on 11/27/2017 10/16/17   Palumbo, April, MD  loratadine (CLARITIN) 10 MG tablet Take 1 tablet (10 mg total) by mouth daily. Patient taking differently: Take 10 mg by mouth daily as needed for allergies.  10/16/17   Palumbo, April, MD  metroNIDAZOLE (FLAGYL) 500 MG tablet Take 1 tablet (500 mg total) by mouth 2 (two) times daily. Patient not taking: Reported on 11/27/2017 09/12/17  Montine Circle, PA-C    Family History Family History  Problem Relation Age of Onset  . Hypertension Mother   . Heart disease Mother   . Arthritis Mother   . Alcohol abuse Father   . Arthritis Father   . Asthma Father   . Diabetes Father   . Drug abuse Father   . Heart disease Father   . Hyperlipidemia Father   . Hypertension Father   . Kidney disease Father   . Stroke Father     Social History Social History   Tobacco Use  . Smoking status: Former Smoker    Packs/day: 0.50    Types: Cigarettes    Last attempt to quit: 10/15/2015    Years since quitting: 2.1  . Smokeless tobacco: Never Used  Substance Use Topics  . Alcohol use: Yes    Comment: rare  . Drug use: Yes    Types: Marijuana     Comment: once per month     Allergies   Patient has no known allergies.   Review of Systems Review of Systems  Constitutional: Negative for fever.  HENT: Negative for sore throat.   Eyes: Negative for visual disturbance.  Respiratory: Negative for cough and shortness of breath.   Cardiovascular: Positive for chest pain. Negative for leg swelling.  Gastrointestinal: Negative for abdominal pain.  Genitourinary: Negative for difficulty urinating.  Musculoskeletal: Negative for back pain and neck pain.  Skin: Negative for rash.  Neurological: Positive for light-headedness. Negative for syncope and headaches.     Physical Exam Updated Vital Signs BP (!) 132/91   Pulse 70   Temp 98.6 F (37 C) (Oral)   Resp 19   LMP 10/17/2014   SpO2 100%   Physical Exam  Constitutional: She is oriented to person, place, and time. She appears well-developed and well-nourished. No distress.  HENT:  Head: Normocephalic and atraumatic.  Eyes: Conjunctivae and EOM are normal.  Neck: Normal range of motion.  Cardiovascular: Normal rate, regular rhythm, normal heart sounds and intact distal pulses. Exam reveals no gallop and no friction rub.  No murmur heard. Pulmonary/Chest: Effort normal and breath sounds normal. No respiratory distress. She has no wheezes. She has no rales.  Chest wall tenderness   Abdominal: Soft. She exhibits no distension. There is no tenderness. There is no guarding.  Musculoskeletal: She exhibits no edema or tenderness.  Neurological: She is alert and oriented to person, place, and time.  Skin: Skin is warm and dry. No rash noted. She is not diaphoretic. No erythema.  Nursing note and vitals reviewed.    ED Treatments / Results  Labs (all labs ordered are listed, but only abnormal results are displayed) Labs Reviewed  CBC - Abnormal; Notable for the following components:      Result Value   MCHC 36.4 (*)    All other components within normal limits  BASIC  METABOLIC PANEL  I-STAT TROPONIN, ED  I-STAT BETA HCG BLOOD, ED ([facility], [facility], [facility] ONLY)  I-STAT TROPONIN, ED    EKG  EKG Interpretation  Date/Time:  Wednesday November 27 2017 16:49:02 EST Ventricular Rate:  72 PR Interval:    QRS Duration: 101 QT Interval:  376 QTC Calculation: 412 R Axis:   84 Text Interpretation:  Sinus rhythm RSR' in V1 or V2, right VCD or RVH Baseline wander in lead(s) V5 No significant change since last tracing Confirmed by Gareth Morgan [phone]) on 11/27/2017 5:45:24 PM Also confirmed by Gareth Morgan [phone]), editor Laurena Spies (  [id number])  on 11/28/2017 7:58:09 AM       Radiology Dg Chest 2 View  Result Date: 11/27/2017 CLINICAL DATA:  Chest pain EXAM: CHEST  2 VIEW COMPARISON:  08/03/2015 FINDINGS: Normal heart size. Lungs clear. No pneumothorax. No pleural effusion. IMPRESSION: No active cardiopulmonary disease. Electronically Signed   By: Marybelle Killings M.D.   On: 11/27/2017 17:27    Procedures Procedures (including critical care time)  Medications Ordered in ED Medications - No data to display   Initial Impression / Assessment and Plan / ED Course  I have reviewed the triage vital signs and the nursing notes.  Pertinent labs & imaging results that were available during my care of the patient were reviewed by me and considered in my medical decision making (see chart for details).     36 year old female presents with concern of chest pain. Differential diagnosis for chest pain includes pulmonary embolus, dissection, pneumothorax, pneumonia, ACS, myocarditis, pericarditis.  EKG was done and evaluate by me and showed no acute ST changes and no signs of pericarditis. Chest x-ray was done and evaluated by me and radiology and showed no sign of pneumonia or pneumothorax. Patient is PERC negative and low risk Wells and have low suspicion for PE.  Patient is low risk HEART score and had delta troponins which were both negative. Given this evaluation,  history and physical have low suspicion for pulmonary embolus, pneumonia, ACS, myocarditis, pericarditis, dissection.   Patient most likely with musculoskeletal chest pain given pain with palpation as well as her movements, history of lifting heavy boxes at work. Recommend ibuprofen, heat/ice.  She also has arm pain to the forearm, worse with extension, most consistent with lateral epicondylitis. Patient discharged in stable condition with understanding of reasons to return and recommend PCP follow up.   Final Clinical Impressions(s) / ED Diagnoses   Final diagnoses:  Nonspecific chest pain  Chest wall pain  Muscle strain  Lateral epicondylitis of left elbow    ED Discharge Orders    None       Gareth Morgan, MD 11/28/17 1231

## 2018-02-01 ENCOUNTER — Emergency Department (HOSPITAL_COMMUNITY)
Admission: EM | Admit: 2018-02-01 | Discharge: 2018-02-01 | Disposition: A | Discharge status: Home / Self Care | Payer: Medicaid Other | Attending: Emergency Medicine | Admitting: Emergency Medicine

## 2018-02-01 ENCOUNTER — Emergency Department (HOSPITAL_COMMUNITY): Payer: Medicaid Other

## 2018-02-01 DIAGNOSIS — Y92013 Bedroom of single-family (private) house as the place of occurrence of the external cause: Secondary | ICD-10-CM | POA: Insufficient documentation

## 2018-02-01 DIAGNOSIS — W57XXXA Bitten or stung by nonvenomous insect and other nonvenomous arthropods, initial encounter: Secondary | ICD-10-CM | POA: Insufficient documentation

## 2018-02-01 DIAGNOSIS — F1210 Cannabis abuse, uncomplicated: Secondary | ICD-10-CM | POA: Insufficient documentation

## 2018-02-01 DIAGNOSIS — Z87891 Personal history of nicotine dependence: Secondary | ICD-10-CM | POA: Insufficient documentation

## 2018-02-01 DIAGNOSIS — R2232 Localized swelling, mass and lump, left upper limb: Secondary | ICD-10-CM | POA: Insufficient documentation

## 2018-02-01 DIAGNOSIS — S40862A Insect bite (nonvenomous) of left upper arm, initial encounter: Secondary | ICD-10-CM | POA: Insufficient documentation

## 2018-02-01 DIAGNOSIS — Y998 Other external cause status: Secondary | ICD-10-CM | POA: Insufficient documentation

## 2018-02-01 DIAGNOSIS — Y9384 Activity, sleeping: Secondary | ICD-10-CM | POA: Insufficient documentation

## 2018-02-01 MED ORDER — HYDROCORTISONE 1 % EX CREA: TOPICAL_CREAM | CUTANEOUS | 0 refills | Status: DC

## 2018-02-01 MED ORDER — CEPHALEXIN 500 MG PO CAPS: 500.0000 mg | ORAL_CAPSULE | Freq: Four times a day (QID) | ORAL | 0 refills | Status: DC

## 2018-02-01 NOTE — ED Triage Notes (Signed)
Redness/swelling and tenderness to left elbow. Patient states that she think something bit her. Full ROM. Denies any recent injury.

## 2018-02-01 NOTE — ED Provider Notes (Signed)
[facility] [place] DEPT Provider Note   CSN: [id number] Arrival date & time: 02/01/18  1752     History   Chief Complaint Chief Complaint  Patient presents with  . Joint Swelling    HPI Anna Schroeder is a 37 y.o. female.  HPI 37 year old African-American female with no pertinent past medical history presents to the ED with bug bite to the left elbow.  Patient states that last night she thinks that something bit her while she was sleeping.  She reports redness and swelling to the left mid arm over the lateral elbow.  Patient denies any pain with bending the elbow.  Denies any systemic symptoms of fevers or chills.  States his occurred last night while she was sleeping.  Reports the area is very itchy.  She has not taking for his symptoms prior to arrival.  Denies any other associated symptoms including paresthesias or weakness. Past Medical History:  Diagnosis Date  . BV (bacterial vaginosis)   . Chlamydia   . Family history of anesthesia complication    parents were slow to wake up  . Fibroids   . Pelvic mass   . Vaginal yeast infection     Patient Active Problem List   Diagnosis Date Noted  . Endometriosis determined by laparoscopy 10/30/2014  . Pelvic pain in female 09/27/2014  . OBESITY, NOS 02/20/2007    Past Surgical History:  Procedure Laterality Date  . CESAREAN SECTION    . ROBOTIC ASSISTED TOTAL HYSTERECTOMY N/A 10/29/2014   Procedure: ROBOTIC ASSISTED TOTAL HYSTERECTOMY, LEFT SALPINGOOOPHERECTOMY, RIGHT SALPINGECTOMY;  Surgeon: Lahoma Crocker, MD;  Location: [facility];  Service: Gynecology;  Laterality: N/A;  . TUBAL LIGATION      OB History    Gravida Para Term Preterm AB Living   3 3 3     3    SAB TAB Ectopic Multiple Live Births           3       Home Medications    Prior to Admission medications   Medication Sig Start Date End Date Taking? Authorizing Provider  acetaminophen (TYLENOL) 500 MG tablet Take 1 tablet (500 mg  total) by mouth every 6 (six) hours as needed. Patient not taking: Reported on 05/07/2017 11/10/15   Gloriann Loan, PA-C  cephALEXin (KEFLEX) 500 MG capsule Take 1 capsule (500 mg total) by mouth 4 (four) times daily. 02/01/18   Doristine Devoid, PA-C  cyclobenzaprine (FLEXERIL) 10 MG tablet Take 1 tablet (10 mg total) by mouth every 8 (eight) hours as needed for muscle spasms. 05/07/17   Shelly Bombard, MD  fluticasone (FLONASE) 50 MCG/ACT nasal spray Place 2 sprays into both nostrils daily. Patient not taking: Reported on 11/27/2017 10/16/17   Palumbo, April, MD  hydrocortisone cream 1 % Apply to affected area 2 times daily 02/01/18   Ocie Cornfield T, PA-C  ibuprofen (ADVIL,MOTRIN) 800 MG tablet Take 1 tablet (800 mg total) by mouth every 8 (eight) hours as needed. Patient taking differently: Take 800 mg by mouth every 8 (eight) hours as needed for moderate pain.  05/07/17   Shelly Bombard, MD  loratadine (CLARITIN) 10 MG tablet Take 1 tablet (10 mg total) by mouth daily. Patient taking differently: Take 10 mg by mouth daily as needed for allergies.  10/16/17   Palumbo, April, MD  metroNIDAZOLE (FLAGYL) 500 MG tablet Take 1 tablet (500 mg total) by mouth 2 (two) times daily. Patient not taking: Reported on 11/27/2017 09/12/17  Montine Circle, PA-C    Family History Family History  Problem Relation Age of Onset  . Hypertension Mother   . Heart disease Mother   . Arthritis Mother   . Alcohol abuse Father   . Arthritis Father   . Asthma Father   . Diabetes Father   . Drug abuse Father   . Heart disease Father   . Hyperlipidemia Father   . Hypertension Father   . Kidney disease Father   . Stroke Father     Social History Social History   Tobacco Use  . Smoking status: Former Smoker    Packs/day: 0.50    Types: Cigarettes    Last attempt to quit: 10/15/2015    Years since quitting: 2.3  . Smokeless tobacco: Never Used  Substance Use Topics  . Alcohol use: Yes    Comment:  rare  . Drug use: Yes    Types: Marijuana    Comment: once per month     Allergies   Patient has no known allergies.   Review of Systems Review of Systems  Constitutional: Negative for chills and fever.  Musculoskeletal: Positive for myalgias. Negative for joint swelling.  Skin: Positive for color change.  Neurological: Negative for weakness and numbness.     Physical Exam Updated Vital Signs BP (!) 140/92 (BP Location: Right Arm)   Pulse 79   Temp 98.1 F (36.7 C) (Oral)   Resp 18   LMP 10/17/2014   SpO2 100%   Physical Exam  Constitutional: She appears well-developed and well-nourished. No distress.  HENT:  Head: Normocephalic and atraumatic.  Eyes: Right eye exhibits no discharge. Left eye exhibits no discharge. No scleral icterus.  Neck: Normal range of motion.  Pulmonary/Chest: No respiratory distress.  Musculoskeletal: Normal range of motion.       Left forearm: She exhibits no tenderness, no bony tenderness, no swelling, no edema, no deformity and no laceration.  Patient has what appears to be a bug bite to the left lateral elbow.  There is surrounding erythema and warmth.  There is no area of fluctuance.  This does not extend over the elbow itself.  It is localized to the left lateral aspect of the arm.  She has full range of motion of the left elbow without pain.  Radial pulses are 2+ bilaterally.  Sensation is intact.  Cap refill is normal.  Neurological: She is alert.  Skin: No pallor.  Psychiatric: Her behavior is normal. Judgment and thought content normal.  Nursing note and vitals reviewed.    ED Treatments / Results  Labs (all labs ordered are listed, but only abnormal results are displayed) Labs Reviewed - No data to display  EKG  EKG Interpretation None       Radiology Dg Elbow Complete Left  Result Date: 02/01/2018 CLINICAL DATA:  Redness and swelling on posterior left elbow x 2-3 days; pt states she might have been bitten by something  EXAM: LEFT ELBOW - COMPLETE 3+ VIEW COMPARISON:  None. FINDINGS: No fracture.  No bone lesion. The elbow joint is normally spaced and aligned. No arthropathic changes. No joint effusion. There is subcutaneous edema over the dorsal aspect of the elbow. No radiopaque foreign body. IMPRESSION: 1. No fracture, bone lesion or elbow joint abnormality. 2. Soft tissue edema. Electronically Signed   By: Lajean Manes M.D.   On: 02/01/2018 18:52    Procedures Procedures (including critical care time)  Medications Ordered in ED Medications - No data to display  Initial Impression / Assessment and Plan / ED Course  I have reviewed the triage vital signs and the nursing notes.  Pertinent labs & imaging results that were available during my care of the patient were reviewed by me and considered in my medical decision making (see chart for details).     Patient has what appears to be a bug bite to the left lateral elbow.  This is not over the actual elbow joint.  She has full range of motion without pain.  No systemic symptoms.  X-ray shows no joint effusion.  Doubt septic arthritis.  This appears to be more reaction from bug bite however will start on antibiotics to prevent infection and have give hydrocortisone cream to help with the itching.  No area of fluctuance that would require abscess drainage.  Patient is hemodynamically stable.  Instructed patient to follow-up in 2 days if symptoms not improving or sooner if symptoms worsen including signs and symptoms of septic joint.  Pt is hemodynamically stable, in NAD, & able to ambulate in the ED. Evaluation does not show pathology that would require ongoing emergent intervention or inpatient treatment. I explained the diagnosis to the patient. Pain has been managed & has no complaints prior to dc. Pt is comfortable with above plan and is stable for discharge at this time. All questions were answered prior to disposition. Strict return precautions for f/u to  the ED were discussed. Encouraged follow up with PCP.   Final Clinical Impressions(s) / ED Diagnoses   Final diagnoses:  Bug bite, initial encounter    ED Discharge Orders        Ordered    cephALEXin (KEFLEX) 500 MG capsule  4 times daily     02/01/18 1940    hydrocortisone cream 1 %     02/01/18 1940       Aaron Edelman 02/01/18 1944    Charlesetta Shanks, MD 02/02/18 [phone]

## 2018-02-01 NOTE — Discharge Instructions (Signed)
Please take antibiotics as prescribed.  This will prevent infection.  May use the cream to help with the itching and swelling.  I would also encourage Benadryl to help with the itching tonight.  Take Motrin or Tylenol for pain.  Apply ice.  Recheck in 2 days if symptoms not improving or sooner if symptoms worsen including unable to bend her elbow, fevers or worsening symptoms.

## 2018-02-10 IMAGING — CR DG CHEST 2V
2 series · 2 of 2 positions shown · non-contrast
Comparison: 08/03/2015

CLINICAL DATA: Chest pain

EXAM:
CHEST  2 VIEW

[w chest pa]
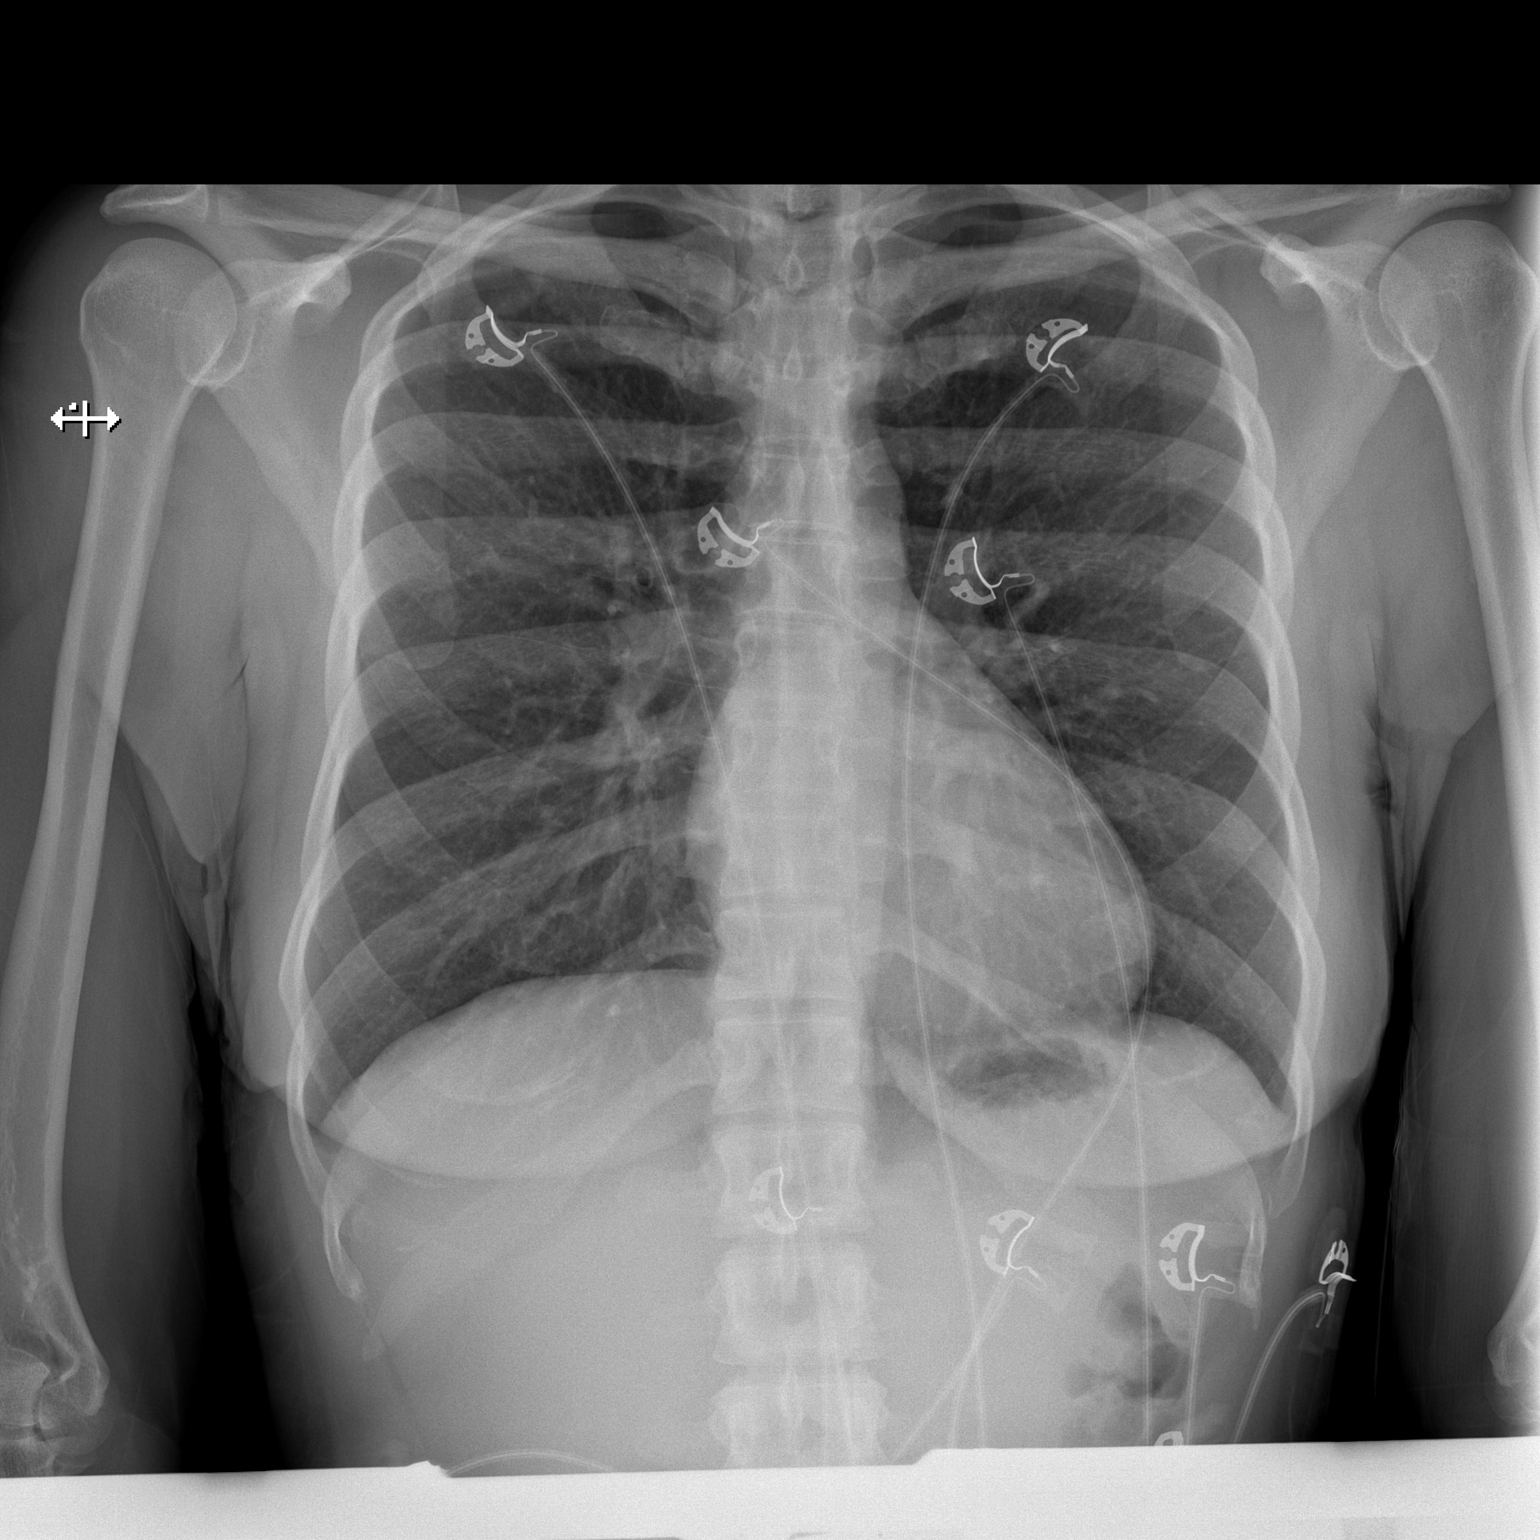

[w chest lat]
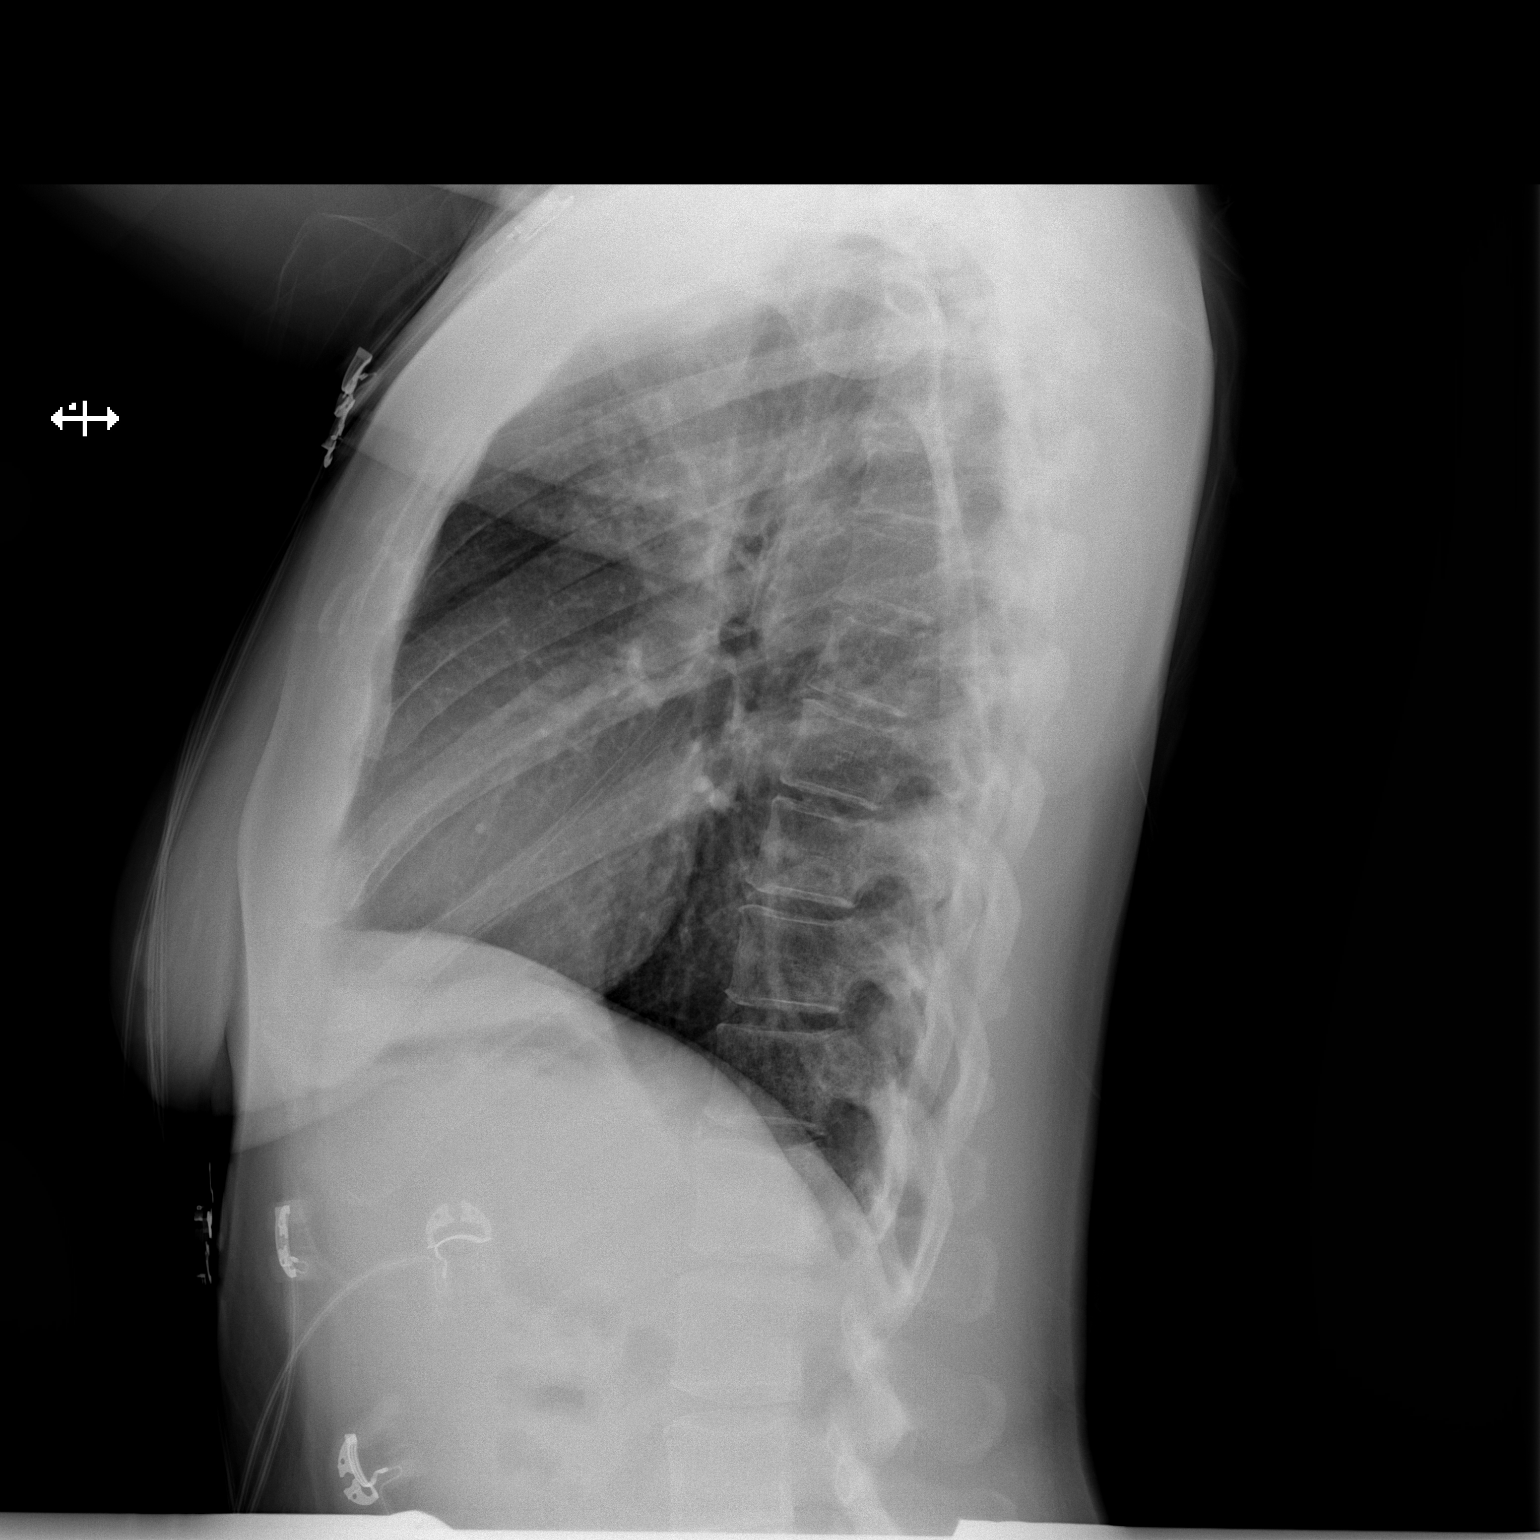

[2 of 2 positions shown; findings below may reference images not displayed]

FINDINGS: Normal heart size. Lungs clear. No pneumothorax. No pleural
effusion.
IMPRESSION: No active cardiopulmonary disease.

## 2018-02-15 ENCOUNTER — Emergency Department (HOSPITAL_COMMUNITY)
Admission: EM | Admit: 2018-02-15 | Discharge: 2018-02-15 | Disposition: A | Discharge status: Home / Self Care | Payer: Self-pay | Attending: Emergency Medicine | Admitting: Emergency Medicine

## 2018-02-15 ENCOUNTER — Encounter (HOSPITAL_COMMUNITY): Payer: Self-pay

## 2018-02-15 ENCOUNTER — Emergency Department (HOSPITAL_COMMUNITY): Payer: Self-pay

## 2018-02-15 DIAGNOSIS — B9689 Other specified bacterial agents as the cause of diseases classified elsewhere: Secondary | ICD-10-CM | POA: Insufficient documentation

## 2018-02-15 DIAGNOSIS — R102 Pelvic and perineal pain: Secondary | ICD-10-CM | POA: Insufficient documentation

## 2018-02-15 DIAGNOSIS — N760 Acute vaginitis: Secondary | ICD-10-CM | POA: Insufficient documentation

## 2018-02-15 DIAGNOSIS — Z87891 Personal history of nicotine dependence: Secondary | ICD-10-CM | POA: Insufficient documentation

## 2018-02-15 DIAGNOSIS — Z79899 Other long term (current) drug therapy: Secondary | ICD-10-CM | POA: Insufficient documentation

## 2018-02-15 DIAGNOSIS — R300 Dysuria: Secondary | ICD-10-CM | POA: Insufficient documentation

## 2018-02-15 LAB — COMPREHENSIVE METABOLIC PANEL
ALK PHOS: 38 U/L (ref 38–126)
ALT: 21 U/L (ref 14–54)
ANION GAP: 9 (ref 5–15)
AST: 28 U/L (ref 15–41)
Albumin: 4 g/dL (ref 3.5–5.0)
BUN: 15 mg/dL (ref 6–20)
CALCIUM: 9 mg/dL (ref 8.9–10.3)
CHLORIDE: 104 mmol/L (ref 101–111)
CO2: 22 mmol/L (ref 22–32)
CREATININE: 0.74 mg/dL (ref 0.44–1.00)
GFR calc Af Amer: >60 mL/min (ref >60)
GFR calc non Af Amer: >60 mL/min (ref >60)
Glucose, Bld: 96 mg/dL (ref 65–99)
Potassium: 4 mmol/L (ref 3.5–5.1)
SODIUM: 135 mmol/L (ref 135–145)
Total Bilirubin: 0.8 mg/dL (ref 0.3–1.2)
Total Protein: 7.2 g/dL (ref 6.5–8.1)

## 2018-02-15 LAB — URINALYSIS, ROUTINE W REFLEX MICROSCOPIC
BILIRUBIN URINE: NEGATIVE
GLUCOSE, UA: NEGATIVE mg/dL
HGB URINE DIPSTICK: NEGATIVE
Ketones, ur: NEGATIVE mg/dL
Leukocytes, UA: NEGATIVE
Nitrite: NEGATIVE
Protein, ur: NEGATIVE mg/dL
SPECIFIC GRAVITY, URINE: 1.028 (ref 1.005–1.030)
pH: 6 (ref 5.0–8.0)

## 2018-02-15 LAB — WET PREP, GENITAL
SPERM: NONE SEEN
Trich, Wet Prep: NONE SEEN
YEAST WET PREP: NONE SEEN

## 2018-02-15 LAB — CBC
HCT: 38.8 % (ref 36.0–46.0)
HEMOGLOBIN: 13.7 g/dL (ref 12.0–15.0)
MCH: 30 pg (ref 26.0–34.0)
MCHC: 35.3 g/dL (ref 30.0–36.0)
MCV: 84.9 fL (ref 78.0–100.0)
Platelets: 185 10*3/uL (ref 150–400)
RBC: 4.57 MIL/uL (ref 3.87–5.11)
RDW: 12.6 % (ref 11.5–15.5)
WBC: 6.6 10*3/uL (ref 4.0–10.5)

## 2018-02-15 LAB — PREGNANCY, URINE: PREG TEST UR: NEGATIVE

## 2018-02-15 LAB — LIPASE, BLOOD: LIPASE: 21 U/L (ref 11–51)

## 2018-02-15 MED ORDER — METRONIDAZOLE 500 MG PO TABS: 500.0000 mg | ORAL_TABLET | Freq: Two times a day (BID) | ORAL | 0 refills | Status: DC

## 2018-02-15 MED ORDER — IOPAMIDOL (ISOVUE-300) INJECTION 61%
INTRAVENOUS | Status: AC
  Administered 2018-02-15: 100 mL
  Filled 2018-02-15: qty 100

## 2018-02-15 MED ORDER — SODIUM CHLORIDE 0.9 % IV BOLUS (SEPSIS)
1000.0000 mL | Freq: Once | INTRAVENOUS | Status: AC
  Administered 2018-02-15: 1000 mL via INTRAVENOUS

## 2018-02-15 NOTE — ED Triage Notes (Signed)
Patient complains of lower back and abdominal cramping since Wednesday. States she thinks related to antibiotic she recently took for spider bite. Also took diflucan recently for yeast infection following antibiotic. Complains of dysuria with same

## 2018-02-15 NOTE — ED Provider Notes (Signed)
[facility] Provider Note   CSN: [id number] Arrival date & time: 02/15/18  1517     History   Chief Complaint No chief complaint on file.   HPI Anna Schroeder is a 37 y.o. female with a history of total hysterectomy with left salpingo-oophorectomy and right salpingectomy, presents today for evaluation of pelvic pain.  She reports that she has been having lower back and abdominal cramping since Wednesday.  She reports that it came on gradually and has been gradually worsening.  She reports that on/9 she was seen and treated with Keflex for a superficial skin infection, after which she reports she developed a yeast infection for which she took Diflucan.  She reports that she had improvement after the diflucan but continues to have vaginal drainage.  She reports occasional nausea with out vomiting.  Her pain is made worse with positional changes, made better by standing up.  She reports occasional dysuria.  Her pain radiates to her back bilaterally.   No fevers or chills.   HPI  Past Medical History:  Diagnosis Date  . BV (bacterial vaginosis)   . Chlamydia   . Family history of anesthesia complication    parents were slow to wake up  . Fibroids   . Pelvic mass   . Vaginal yeast infection     Patient Active Problem List   Diagnosis Date Noted  . Endometriosis determined by laparoscopy 10/30/2014  . Pelvic pain in female 09/27/2014  . OBESITY, NOS 02/20/2007    Past Surgical History:  Procedure Laterality Date  . CESAREAN SECTION    . ROBOTIC ASSISTED TOTAL HYSTERECTOMY N/A 10/29/2014   Procedure: ROBOTIC ASSISTED TOTAL HYSTERECTOMY, LEFT SALPINGOOOPHERECTOMY, RIGHT SALPINGECTOMY;  Surgeon: Lahoma Crocker, MD;  Location: [facility];  Service: Gynecology;  Laterality: N/A;  . TUBAL LIGATION      OB History    Gravida Para Term Preterm AB Living   3 3 3     3    SAB TAB Ectopic Multiple Live Births           3       Home  Medications    Prior to Admission medications   Medication Sig Start Date End Date Taking? Authorizing Provider  acetaminophen (TYLENOL) 500 MG tablet Take 1 tablet (500 mg total) by mouth every 6 (six) hours as needed. Patient not taking: Reported on 05/07/2017 11/10/15   Gloriann Loan, PA-C  cephALEXin (KEFLEX) 500 MG capsule Take 1 capsule (500 mg total) by mouth 4 (four) times daily. 02/01/18   Doristine Devoid, PA-C  cyclobenzaprine (FLEXERIL) 10 MG tablet Take 1 tablet (10 mg total) by mouth every 8 (eight) hours as needed for muscle spasms. 05/07/17   Shelly Bombard, MD  fluticasone (FLONASE) 50 MCG/ACT nasal spray Place 2 sprays into both nostrils daily. Patient not taking: Reported on 11/27/2017 10/16/17   Palumbo, April, MD  hydrocortisone cream 1 % Apply to affected area 2 times daily 02/01/18   Ocie Cornfield T, PA-C  ibuprofen (ADVIL,MOTRIN) 800 MG tablet Take 1 tablet (800 mg total) by mouth every 8 (eight) hours as needed. Patient taking differently: Take 800 mg by mouth every 8 (eight) hours as needed for moderate pain.  05/07/17   Shelly Bombard, MD  loratadine (CLARITIN) 10 MG tablet Take 1 tablet (10 mg total) by mouth daily. Patient taking differently: Take 10 mg by mouth daily as needed for allergies.  10/16/17   Palumbo, April, MD  metroNIDAZOLE (FLAGYL) 500 MG tablet Take 1 tablet (500 mg total) by mouth 2 (two) times daily. 02/15/18   Lorin Glass, PA-C    Family History Family History  Problem Relation Age of Onset  . Hypertension Mother   . Heart disease Mother   . Arthritis Mother   . Alcohol abuse Father   . Arthritis Father   . Asthma Father   . Diabetes Father   . Drug abuse Father   . Heart disease Father   . Hyperlipidemia Father   . Hypertension Father   . Kidney disease Father   . Stroke Father     Social History Social History   Tobacco Use  . Smoking status: Former Smoker    Packs/day: 0.50    Types: Cigarettes    Last attempt to  quit: 10/15/2015    Years since quitting: 2.3  . Smokeless tobacco: Never Used  Substance Use Topics  . Alcohol use: Yes    Comment: rare  . Drug use: Yes    Types: Marijuana    Comment: once per month     Allergies   Patient has no known allergies.   Review of Systems Review of Systems  Constitutional: Negative for chills and fever.  Gastrointestinal: Positive for abdominal pain and nausea. Negative for constipation, diarrhea and vomiting.  Genitourinary: Positive for dysuria, pelvic pain and vaginal discharge. Negative for frequency, urgency and vaginal pain.  All other systems reviewed and are negative.    Physical Exam Updated Vital Signs BP (!) 133/93   Pulse 83   Temp 98.1 F (36.7 C) (Oral)   Resp 18   LMP 10/17/2014   SpO2 100%   Physical Exam  Constitutional: She appears well-developed and well-nourished. No distress.  HENT:  Head: Normocephalic and atraumatic.  Eyes: Conjunctivae are normal. Right eye exhibits no discharge. Left eye exhibits no discharge. No scleral icterus.  Neck: Normal range of motion.  Cardiovascular: Normal rate, regular rhythm and intact distal pulses.  Pulmonary/Chest: Effort normal. No stridor. No respiratory distress.  Abdominal: Soft. Bowel sounds are normal. She exhibits no distension and no mass. There is tenderness (Abdomen is tender to palpation in right lower quadrant.  Patient in this area both re-creates and exacerbates her reported pain.). There is guarding. There is no rebound. No hernia.  No cva tenderness.  No brusing or marks to abdomen.   Genitourinary:  Genitourinary Comments: Performed with chaperone in room.  There is a mass in the right adnexa that is tender to palpation.  Scant vaginal discharge, white in color.  There are no lesions on the labia.   Musculoskeletal: She exhibits no edema or deformity.  Neurological: She is alert. She exhibits normal muscle tone.  Skin: Skin is warm and dry. She is not diaphoretic.   Psychiatric: She has a normal mood and affect. Her behavior is normal.  Nursing note and vitals reviewed.    ED Treatments / Results  Labs (all labs ordered are listed, but only abnormal results are displayed) Labs Reviewed  WET PREP, GENITAL - Abnormal; Notable for the following components:      Result Value   Clue Cells Wet Prep HPF POC PRESENT (*)    WBC, Wet Prep HPF POC FEW (*)    All other components within normal limits  URINALYSIS, ROUTINE W REFLEX MICROSCOPIC - Abnormal; Notable for the following components:   APPearance HAZY (*)    All other components within normal limits  LIPASE, BLOOD  COMPREHENSIVE  METABOLIC PANEL  CBC  PREGNANCY, URINE  I-STAT BETA HCG BLOOD, ED ([facility], [facility], [facility] ONLY)  GC/CHLAMYDIA PROBE AMP ([place]) NOT AT [facility]    EKG  EKG Interpretation None       Radiology US Transvaginal Non-ob  Result Date: 02/15/2018 CLINICAL DATA:  Previous hysterectomy and left oophorectomy. Bilateral pelvic pain for several days. EXAM: TRANSABDOMINAL AND TRANSVAGINAL ULTRASOUND OF PELVIS DOPPLER ULTRASOUND OF OVARIES TECHNIQUE: Both transabdominal and transvaginal ultrasound examinations of the pelvis were performed. Transabdominal technique was performed for global imaging of the pelvis including uterus, ovaries, adnexal regions, and pelvic cul-de-sac. It was necessary to proceed with endovaginal exam following the transabdominal exam to visualize the ovary. Color and duplex Doppler ultrasound was utilized to evaluate blood flow to the ovaries. COMPARISON:  None. FINDINGS: Uterus Surgically absent. Right ovary Measurements: 5.8 x 4.6 x 5.5 cm. The right ovary contains 2 masses. The largest mass measures 3.6 x 3.9 x 3.8 cm. It demonstrates mixed echogenicity but is primarily hypoechoic with numerous hyperechoic septations. There is blood flow around this mass. There is not definitive blood flow within the mass. A second smaller mass is more homogeneously hypoechoic  measuring 1.5 x 1.1 x 1.0 cm. The remainder of the right ovary is normal in appearance with 2 small normal appearing follicles. Left ovary Surgically absent. Pulsed Doppler evaluation of the right ovary demonstrates normal low-resistance arterial and venous waveforms. Other findings There is a small amount of free fluid adjacent to the right ovary. IMPRESSION: 1. The right ovary contains 2 masses. The larger mass is probably a hemorrhagic cyst and the smaller may be a corpus luteum cyst. Recommend a follow-up ultrasound in 6-12 weeks to ensure resolution. No evidence of torsion on today's study. Electronically Signed   By: Dorise Bullion III M.D   On: 02/15/2018 16:27   US Pelvis Complete  Result Date: 02/15/2018 CLINICAL DATA:  Previous hysterectomy and left oophorectomy. Bilateral pelvic pain for several days. EXAM: TRANSABDOMINAL AND TRANSVAGINAL ULTRASOUND OF PELVIS DOPPLER ULTRASOUND OF OVARIES TECHNIQUE: Both transabdominal and transvaginal ultrasound examinations of the pelvis were performed. Transabdominal technique was performed for global imaging of the pelvis including uterus, ovaries, adnexal regions, and pelvic cul-de-sac. It was necessary to proceed with endovaginal exam following the transabdominal exam to visualize the ovary. Color and duplex Doppler ultrasound was utilized to evaluate blood flow to the ovaries. COMPARISON:  None. FINDINGS: Uterus Surgically absent. Right ovary Measurements: 5.8 x 4.6 x 5.5 cm. The right ovary contains 2 masses. The largest mass measures 3.6 x 3.9 x 3.8 cm. It demonstrates mixed echogenicity but is primarily hypoechoic with numerous hyperechoic septations. There is blood flow around this mass. There is not definitive blood flow within the mass. A second smaller mass is more homogeneously hypoechoic measuring 1.5 x 1.1 x 1.0 cm. The remainder of the right ovary is normal in appearance with 2 small normal appearing follicles. Left ovary Surgically absent. Pulsed  Doppler evaluation of the right ovary demonstrates normal low-resistance arterial and venous waveforms. Other findings There is a small amount of free fluid adjacent to the right ovary. IMPRESSION: 1. The right ovary contains 2 masses. The larger mass is probably a hemorrhagic cyst and the smaller may be a corpus luteum cyst. Recommend a follow-up ultrasound in 6-12 weeks to ensure resolution. No evidence of torsion on today's study. Electronically Signed   By: Dorise Bullion III M.D   On: 02/15/2018 16:27   Ct Abdomen Pelvis W Contrast  Result  Date: 02/15/2018 CLINICAL DATA:  Lower abdominal pain radiating to the back, 3 days duration. EXAM: CT ABDOMEN AND PELVIS WITH CONTRAST TECHNIQUE: Multidetector CT imaging of the abdomen and pelvis was performed using the standard protocol following bolus administration of intravenous contrast. CONTRAST:  17mL ISOVUE-300 IOPAMIDOL (ISOVUE-300) INJECTION 61% COMPARISON:  None. FINDINGS: Lower chest: Normal Hepatobiliary: Normal Pancreas: Normal Spleen: Normal Adrenals/Urinary Tract: Adrenal glands are normal. Kidneys are normal. No cyst, mass, stone or hydronephrosis. Bladder is normal. Stomach/Bowel: No abnormal bowel finding. Vascular/Lymphatic: Normal Reproductive: Previous hysterectomy. Cystic entity in the right adnexa measuring up to 4.8 cm in diameter. This could be an endometrioma or an ovarian cyst. Small amount of adjacent free fluid. Other: No free air. Musculoskeletal: Ordinary sacroiliac degenerative changes. IMPRESSION: Previous hysterectomy. 4.8 cm cystic abnormality in the right pelvis. In a patient with a history of endometriosis, this could be an endometrioma or a cyst related to residual ovarian tissue. Small amount of adjacent free fluid. Electronically Signed   By: Nelson Chimes M.D.   On: 02/15/2018 14:31   [place] Art/ven Flow Abd Pelv Doppler  Result Date: 02/15/2018 CLINICAL DATA:  Previous hysterectomy and left oophorectomy. Bilateral pelvic pain  for several days. EXAM: TRANSABDOMINAL AND TRANSVAGINAL ULTRASOUND OF PELVIS DOPPLER ULTRASOUND OF OVARIES TECHNIQUE: Both transabdominal and transvaginal ultrasound examinations of the pelvis were performed. Transabdominal technique was performed for global imaging of the pelvis including uterus, ovaries, adnexal regions, and pelvic cul-de-sac. It was necessary to proceed with endovaginal exam following the transabdominal exam to visualize the ovary. Color and duplex Doppler ultrasound was utilized to evaluate blood flow to the ovaries. COMPARISON:  None. FINDINGS: Uterus Surgically absent. Right ovary Measurements: 5.8 x 4.6 x 5.5 cm. The right ovary contains 2 masses. The largest mass measures 3.6 x 3.9 x 3.8 cm. It demonstrates mixed echogenicity but is primarily hypoechoic with numerous hyperechoic septations. There is blood flow around this mass. There is not definitive blood flow within the mass. A second smaller mass is more homogeneously hypoechoic measuring 1.5 x 1.1 x 1.0 cm. The remainder of the right ovary is normal in appearance with 2 small normal appearing follicles. Left ovary Surgically absent. Pulsed Doppler evaluation of the right ovary demonstrates normal low-resistance arterial and venous waveforms. Other findings There is a small amount of free fluid adjacent to the right ovary. IMPRESSION: 1. The right ovary contains 2 masses. The larger mass is probably a hemorrhagic cyst and the smaller may be a corpus luteum cyst. Recommend a follow-up ultrasound in 6-12 weeks to ensure resolution. No evidence of torsion on today's study. Electronically Signed   By: Dorise Bullion III M.D   On: 02/15/2018 16:27    Procedures Procedures (including critical care time)  Medications Ordered in ED Medications  sodium chloride 0.9 % bolus 1,000 mL (0 mLs Intravenous Stopped 02/15/18 1714)  iopamidol (ISOVUE-300) 61 % injection (100 mLs  Contrast Given 02/15/18 1408)     Initial Impression /  Assessment and Plan / ED Course  I have reviewed the triage vital signs and the nursing notes.  Pertinent labs & imaging results that were available during my care of the patient were reviewed by me and considered in my medical decision making (see chart for details).  Clinical Course as of Feb 16 1247  Sat Feb 15, 2018  1029 Attempted to see and evaluate patient, she was not in her bed.   [EH]    Clinical Course User Index [EH] Lorin Glass, PA-C  Anna Schroeder presents today for evaluation of approximately 3 days of right lower quadrant pain that is radiating to her back.  She reports generally not feeling well during this time.  Screening labs were obtained and reviewed, patient is not anemic, white count 6.6.  She had RLQ TTP on exam with concern for appendicitis.  Her pelvic exam had Right adenexal TTP and fullness.CT scan obtained with 4cm cystic structure in right ovary.  Ultrasound was performed to better characterize this structure showing multiple masses.  Patient was instructed to follow up with OBGYN for this.  She was instructed that she will need follow up.  She was given strict return precautions and stated her understanding.  OTC pain medication.    Wet prep positive for BV.  She was given rx for flagyl, instructed not to drink alcohol while taking this medication.    Final Clinical Impressions(s) / ED Diagnoses   Final diagnoses:  Pelvic pain  BV (bacterial vaginosis)    ED Discharge Orders        Ordered    metroNIDAZOLE (FLAGYL) 500 MG tablet  2 times daily     02/15/18 1635       Ollen Gross 02/16/18 1706    Lajean Saver, MD 02/21/18 2234

## 2018-02-15 NOTE — ED Triage Notes (Signed)
Patient complains of generalized abdominal cramping with back pain and dysuria. No nausea, no vomiting, no diarrhea. NAD

## 2018-02-15 NOTE — Discharge Instructions (Signed)
1. The right ovary contains 2 masses. The larger mass is probably a  hemorrhagic cyst and the smaller may be a corpus luteum cyst.  Recommend a follow-up ultrasound in 6-12 weeks to ensure resolution.  No evidence of torsion on today's study.   Please take Ibuprofen (Advil, motrin) and Tylenol (acetaminophen) to relieve your pain.  You may take up to 600 MG (3 pills) of normal strength ibuprofen every 8 hours as needed.  In between doses of ibuprofen you make take tylenol, up to 1,000 mg (two extra strength pills).  Do not take more than 3,000 mg tylenol in a 24 hour period.  Please check all medication labels as many medications such as pain and cold medications may contain tylenol.  Do not drink alcohol while taking these medications.  Do not take other NSAID'S while taking ibuprofen (such as aleve or naproxen).  Please take ibuprofen with food to decrease stomach upset.  You may have diarrhea from the antibiotics.  It is very important that you continue to take the antibiotics even if you get diarrhea unless a medical professional tells you that you may stop taking them.  If you stop too early the bacteria you are being treated for will become stronger and you may need different, more powerful antibiotics that have more side effects and worsening diarrhea.  Please stay well hydrated and consider probiotics as they may decrease the severity of your diarrhea.  Please be aware that if you take any hormonal contraception (birth control pills, nexplanon, the ring, etc) that your birth control will not work while you are taking antibiotics and you need to use back up protection as directed on the birth control medication information insert.    Today your diagnosed with bacterial vaginosis and received a prescription for metronidazole also known as Flagyl. It is very important that you do not consume any alcohol while taking this medication as it will cause you to become violently ill.

## 2018-02-17 LAB — GC/CHLAMYDIA PROBE AMP (~~LOC~~) NOT AT ARMC
Chlamydia: NEGATIVE
Neisseria Gonorrhea: NEGATIVE

## 2018-03-05 ENCOUNTER — Telehealth: Payer: Self-pay | Admitting: *Deleted

## 2018-03-05 NOTE — Telephone Encounter (Signed)
Pt called to office stating that she is having back pain. Pt states she was seen in ED in Feb and had ovarian cyst- pt had back and pelvic pain.  Pt was advised to follow up with office if symptoms returned. Pt states that she now is just having back pain, denies cramping/pelvic pain.  Denies any urinary problems. 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  Pt advised that she may try Tylenol/Ibuprofen and heat pack for back pain.  Pt advised most likely work related due to decrease in symptoms.   Pt made aware that if symptoms worsen or are persistent to call office and she could be scheduled to be seen.   Pt states understanding.

## 2018-05-20 ENCOUNTER — Ambulatory Visit (INDEPENDENT_AMBULATORY_CARE_PROVIDER_SITE_OTHER): Payer: Medicaid Other | Admitting: Obstetrics

## 2018-05-20 ENCOUNTER — Encounter: Payer: Self-pay | Admitting: Obstetrics

## 2018-05-20 VITALS — BP 142/88 | HR 65 | Ht 62.0 in | Wt 137.0 lb

## 2018-05-20 DIAGNOSIS — N760 Acute vaginitis: Secondary | ICD-10-CM

## 2018-05-20 DIAGNOSIS — B9689 Other specified bacterial agents as the cause of diseases classified elsewhere: Secondary | ICD-10-CM

## 2018-05-20 DIAGNOSIS — Z309 Encounter for contraceptive management, unspecified: Secondary | ICD-10-CM

## 2018-05-20 DIAGNOSIS — Z01419 Encounter for gynecological examination (general) (routine) without abnormal findings: Secondary | ICD-10-CM

## 2018-05-20 MED ORDER — METRONIDAZOLE 500 MG PO TABS: 500.0000 mg | ORAL_TABLET | Freq: Two times a day (BID) | ORAL | 2 refills | Status: DC

## 2018-05-20 NOTE — Progress Notes (Addendum)
Subjective:        Anna Schroeder is a 37 y.o. female here for a routine exam.  Current complaints: Vaginal discharge.  Denies vaginal odor or irritation.  Personal health questionnaire:  Is patient Ashkenazi Jewish, have a family history of breast and/or ovarian cancer: no Is there a family history of uterine cancer diagnosed at age < 53, gastrointestinal cancer, urinary tract cancer, family member who is a Field seismologist syndrome-associated carrier: no Is the patient overweight and hypertensive, family history of diabetes, personal history of gestational diabetes, preeclampsia or PCOS: no Is patient over 87, have PCOS,  family history of premature CHD under age 20, diabetes, smoke, have hypertension or peripheral artery disease:  no At any time, has a partner hit, kicked or otherwise hurt or frightened you?: no Over the past 2 weeks, have you felt down, depressed or hopeless?: no Over the past 2 weeks, have you felt little interest or pleasure in doing things?:no   Gynecologic History Patient's last menstrual period was 10/17/2014. Contraception: status post hysterectomy Last Pap: 2015. Results were: normal Last mammogram: n/a. Results were: n/a  Obstetric History OB History  Gravida Para Term Preterm AB Living  3 3 3     3   SAB TAB Ectopic Multiple Live Births          3    # Outcome Date GA Lbr Len/2nd Weight Sex Delivery Anes PTL Lv  3 Term      Vag-Spont   LIV  2 Term      CS-LTranv   LIV  1 Term      Vag-Spont   LIV    Past Medical History:  Diagnosis Date  . BV (bacterial vaginosis)   . Chlamydia   . Family history of anesthesia complication    parents were slow to wake up  . Fibroids   . Pelvic mass   . Vaginal yeast infection     Past Surgical History:  Procedure Laterality Date  . CESAREAN SECTION    . ROBOTIC ASSISTED TOTAL HYSTERECTOMY N/A 10/29/2014   Procedure: ROBOTIC ASSISTED TOTAL HYSTERECTOMY, LEFT SALPINGOOOPHERECTOMY, RIGHT SALPINGECTOMY;  Surgeon: Lahoma Crocker, MD;  Location: [facility];  Service: Gynecology;  Laterality: N/A;  . TUBAL LIGATION       Current Outpatient Medications:  .  cyclobenzaprine (FLEXERIL) 10 MG tablet, Take 1 tablet (10 mg total) by mouth every 8 (eight) hours as needed for muscle spasms., Disp: 30 tablet, Rfl: 2 .  fexofenadine (ALLEGRA) 60 MG tablet, Take 60 mg by mouth 2 (two) times daily., Disp: , Rfl:  .  ibuprofen (ADVIL,MOTRIN) 800 MG tablet, Take 1 tablet (800 mg total) by mouth every 8 (eight) hours as needed. (Patient taking differently: Take 800 mg by mouth every 8 (eight) hours as needed for moderate pain. ), Disp: 30 tablet, Rfl: 5 .  acetaminophen (TYLENOL) 500 MG tablet, Take 1 tablet (500 mg total) by mouth every 6 (six) hours as needed. (Patient not taking: Reported on 05/07/2017), Disp: 30 tablet, Rfl: 0 .  fluticasone (FLONASE) 50 MCG/ACT nasal spray, Place 2 sprays into both nostrils daily. (Patient not taking: Reported on 11/27/2017), Disp: 16 g, Rfl: 0 .  hydrocortisone cream 1 %, Apply to affected area 2 times daily, Disp: 15 g, Rfl: 0 .  metroNIDAZOLE (FLAGYL) 500 MG tablet, Take 1 tablet (500 mg total) by mouth 2 (two) times daily., Disp: 14 tablet, Rfl: 2 No Known Allergies  Social History   Tobacco Use  .  Smoking status: Former Smoker    Packs/day: 0.50    Types: Cigarettes    Last attempt to quit: 10/15/2015    Years since quitting: 2.5  . Smokeless tobacco: Never Used  Substance Use Topics  . Alcohol use: Yes    Comment: rare    Family History  Problem Relation Age of Onset  . Hypertension Mother   . Heart disease Mother   . Arthritis Mother   . Alcohol abuse Father   . Arthritis Father   . Asthma Father   . Diabetes Father   . Drug abuse Father   . Heart disease Father   . Hyperlipidemia Father   . Hypertension Father   . Kidney disease Father   . Stroke Father       Review of Systems  Constitutional: negative for fatigue and weight loss Respiratory: negative for  cough and wheezing Cardiovascular: negative for chest pain, fatigue and palpitations Gastrointestinal: negative for abdominal pain and change in bowel habits Musculoskeletal:negative for myalgias Neurological: negative for gait problems and tremors Behavioral/Psych: negative for abusive relationship, depression Endocrine: negative for temperature intolerance    Genitourinary:negative for abnormal menstrual periods, genital lesions, hot flashes, sexual problems and vaginal discharge Integument/breast: negative for breast lump, breast tenderness, nipple discharge and skin lesion(s)    Objective:       BP (!) 142/88   Pulse 65   Ht 5\' 2"  (1.575 m)   Wt 137 lb (62.1 kg)   LMP 10/17/2014   BMI 25.06 kg/m  General:   alert  Skin:   no rash or abnormalities  Lungs:   clear to auscultation bilaterally  Heart:   regular rate and rhythm, S1, S2 normal, no murmur, click, rub or gallop  Breasts:   normal without suspicious masses, skin or nipple changes or axillary nodes  Abdomen:  normal findings: no organomegaly, soft, non-tender and no hernia  Pelvis:  External genitalia: normal general appearance Urinary system: urethral meatus normal and bladder without fullness, nontender Vaginal: normal without tenderness, induration or masses Cervix: absent Adnexa: normal bimanual exam Uterus: absent   Lab Review Urine pregnancy test Labs reviewed yes Radiologic studies reviewed yes  50% of 20 min visit spent on counseling and coordination of care.   Assessment:     1. Encounter for gynecological examination  2. BV (bacterial vaginosis) Rx: - metroNIDAZOLE (FLAGYL) 500 MG tablet; Take 1 tablet (500 mg total) by mouth 2 (two) times daily.  Dispense: 14 tablet; Refill: 2    Plan:    Education reviewed: calcium supplements, depression evaluation, low fat, low cholesterol diet, safe sex/STD prevention, self breast exams and weight bearing exercise. Follow up in: 1 year.   Meds ordered  this encounter  Medications  . metroNIDAZOLE (FLAGYL) 500 MG tablet    Sig: Take 1 tablet (500 mg total) by mouth 2 (two) times daily.    Dispense:  14 tablet    Refill:  2   No orders of the defined types were placed in this encounter.   Shelly Bombard MD 05-20-2018

## 2018-07-26 ENCOUNTER — Other Ambulatory Visit: Payer: Self-pay

## 2018-07-26 ENCOUNTER — Emergency Department (HOSPITAL_COMMUNITY)
Admission: EM | Admit: 2018-07-26 | Discharge: 2018-07-26 | Disposition: A | Discharge status: Home / Self Care | Payer: Medicaid Other | Attending: Emergency Medicine | Admitting: Emergency Medicine

## 2018-07-26 DIAGNOSIS — Z79899 Other long term (current) drug therapy: Secondary | ICD-10-CM | POA: Insufficient documentation

## 2018-07-26 DIAGNOSIS — K5900 Constipation, unspecified: Secondary | ICD-10-CM | POA: Insufficient documentation

## 2018-07-26 DIAGNOSIS — Z87891 Personal history of nicotine dependence: Secondary | ICD-10-CM | POA: Insufficient documentation

## 2018-07-26 DIAGNOSIS — M545 Low back pain, unspecified: Secondary | ICD-10-CM

## 2018-07-26 LAB — URINALYSIS, ROUTINE W REFLEX MICROSCOPIC
Bilirubin Urine: NEGATIVE
Glucose, UA: NEGATIVE mg/dL
Hgb urine dipstick: NEGATIVE
Ketones, ur: 5 mg/dL — AB
Leukocytes, UA: NEGATIVE
Nitrite: NEGATIVE
Protein, ur: NEGATIVE mg/dL
Specific Gravity, Urine: 1.006 (ref 1.005–1.030)
pH: 7 (ref 5.0–8.0)

## 2018-07-26 MED ORDER — MELOXICAM 15 MG PO TABS: 15.0000 mg | ORAL_TABLET | Freq: Every day | ORAL | 0 refills | Status: DC | PRN

## 2018-07-26 NOTE — ED Triage Notes (Signed)
Pt to ER for evaluation of lower back pain and lower abdominal pain x1 week. States constipated, but last BM yesterday. Pt in NAD

## 2018-07-26 NOTE — ED Notes (Signed)
Pt reports has been taking pepto bismol for symptoms.

## 2018-07-26 NOTE — Discharge Instructions (Addendum)
Go to the grocery or drugstore and pick up a bottle of MiraLAX.  Mix four servings into a 32 ounce bottle of a sports drink.  Drink this over 4 to 6 hours or until you have a good bowel movement.  You may repeat again if necessary.

## 2018-07-26 NOTE — ED Notes (Signed)
Patient verbalizes understanding of discharge instructions. Opportunity for questioning and answers were provided. Armband removed by staff, pt discharged from ED.  

## 2018-07-26 NOTE — ED Notes (Signed)
EDP at bedside  

## 2018-07-31 NOTE — ED Provider Notes (Signed)
[facility] EMERGENCY DEPARTMENT Provider Note   CSN: [id number] Arrival date & time: 07/26/18  0906     History   Chief Complaint Chief Complaint  Patient presents with  . Back Pain  . Abdominal Pain    HPI Anna Schroeder is a 37 y.o. female.  HPI   37 year old female with lower back and lower abdominal pain.  Symptom onset about a week ago.  She feels like she may be constipated although she had a small bowel movement yesterday.  He describes the abdominal pain is crampy.  Comes and goes without any appreciable exacerbating factors.  The back pain feels like a dull ache in her mid lower back and does not radiate.  Denies any trauma or strain.  No acute numbness, tingling or focal loss of strength.  No urinary complaints.  No nausea or vomiting.  Past Medical History:  Diagnosis Date  . BV (bacterial vaginosis)   . Chlamydia   . Family history of anesthesia complication    parents were slow to wake up  . Fibroids   . Pelvic mass   . Vaginal yeast infection     Patient Active Problem List   Diagnosis Date Noted  . Endometriosis determined by laparoscopy 10/30/2014  . Pelvic pain in female 09/27/2014  . OBESITY, NOS 02/20/2007    Past Surgical History:  Procedure Laterality Date  . CESAREAN SECTION    . ROBOTIC ASSISTED TOTAL HYSTERECTOMY N/A 10/29/2014   Procedure: ROBOTIC ASSISTED TOTAL HYSTERECTOMY, LEFT SALPINGOOOPHERECTOMY, RIGHT SALPINGECTOMY;  Surgeon: Lahoma Crocker, MD;  Location: [facility];  Service: Gynecology;  Laterality: N/A;  . TUBAL LIGATION       OB History    Gravida  3   Para  3   Term  3   Preterm      AB      Living  3     SAB      TAB      Ectopic      Multiple      Live Births  3            Home Medications    Prior to Admission medications   Medication Sig Start Date End Date Taking? Authorizing Provider  bismuth subsalicylate (PEPTO BISMOL) 262 MG/15ML suspension Take 30 mLs by mouth as needed  for indigestion.   Yes [provider]  cyclobenzaprine (FLEXERIL) 10 MG tablet Take 1 tablet (10 mg total) by mouth every 8 (eight) hours as needed for muscle spasms. 05/07/17  Yes Shelly Bombard, MD  fexofenadine (ALLEGRA) 60 MG tablet Take 60 mg by mouth 2 (two) times daily.   Yes [provider]  ibuprofen (ADVIL,MOTRIN) 800 MG tablet Take 1 tablet (800 mg total) by mouth every 8 (eight) hours as needed. Patient taking differently: Take 800 mg by mouth every 8 (eight) hours as needed for moderate pain.  05/07/17  Yes Shelly Bombard, MD  acetaminophen (TYLENOL) 500 MG tablet Take 1 tablet (500 mg total) by mouth every 6 (six) hours as needed. Patient not taking: Reported on 05/07/2017 11/10/15   Gloriann Loan, PA-C  fluticasone [facility]) 50 MCG/ACT nasal spray Place 2 sprays into both nostrils daily. Patient not taking: Reported on 11/27/2017 10/16/17   Palumbo, April, MD  hydrocortisone cream 1 % Apply to affected area 2 times daily Patient not taking: Reported on 07/26/2018 02/01/18   Doristine Devoid, PA-C  meloxicam (MOBIC) 15 MG tablet Take 1 tablet (15  mg total) by mouth daily as needed for pain. 07/26/18   Virgel Manifold, MD  metroNIDAZOLE (FLAGYL) 500 MG tablet Take 1 tablet (500 mg total) by mouth 2 (two) times daily. Patient not taking: Reported on 07/26/2018 05/20/18   Shelly Bombard, MD    Family History Family History  Problem Relation Age of Onset  . Hypertension Mother   . Heart disease Mother   . Arthritis Mother   . Alcohol abuse Father   . Arthritis Father   . Asthma Father   . Diabetes Father   . Drug abuse Father   . Heart disease Father   . Hyperlipidemia Father   . Hypertension Father   . Kidney disease Father   . Stroke Father     Social History Social History   Tobacco Use  . Smoking status: Former Smoker    Packs/day: 0.50    Types: Cigarettes    Last attempt to quit: 10/15/2015    Years since quitting: 2.7  . Smokeless tobacco:  Never Used  Substance Use Topics  . Alcohol use: Yes    Comment: rare  . Drug use: Yes    Types: Marijuana    Comment: once per month     Allergies   Pineapple   Review of Systems Review of Systems  All systems reviewed and negative, other than as noted in HPI.  Physical Exam Updated Vital Signs BP (!) 139/100   Pulse 73   Temp 98 F (36.7 C) (Oral)   Resp 12   LMP 10/17/2014   SpO2 100%   Physical Exam  Constitutional: She appears well-developed and well-nourished. No distress.  HENT:  Head: Normocephalic and atraumatic.  Eyes: Conjunctivae are normal. Right eye exhibits no discharge. Left eye exhibits no discharge.  Neck: Neck supple.  Cardiovascular: Normal rate, regular rhythm and normal heart sounds. Exam reveals no gallop and no friction rub.  No murmur heard. Pulmonary/Chest: Effort normal and breath sounds normal. No respiratory distress.  Abdominal: Soft. She exhibits no distension. There is no tenderness.  Musculoskeletal: She exhibits no edema or tenderness.  Neurological: She is alert.  Normal strength in lower extremities.  Ambulatory without difficulty.  Sensation is intact light touch.  Skin: Skin is warm and dry.  Psychiatric: She has a normal mood and affect. Her behavior is normal. Thought content normal.  Nursing note and vitals reviewed.    ED Treatments / Results  Labs (all labs ordered are listed, but only abnormal results are displayed) Labs Reviewed  URINALYSIS, ROUTINE W REFLEX MICROSCOPIC - Abnormal; Notable for the following components:      Result Value   Color, Urine STRAW (*)    Ketones, ur 5 (*)    All other components within normal limits    EKG None  Radiology No results found.  Procedures Procedures (including critical care time)  Medications Ordered in ED Medications - No data to display   Initial Impression / Assessment and Plan / ED Course  I have reviewed the triage vital signs and the nursing  notes.  Pertinent labs & imaging results that were available during my care of the patient were reviewed by me and considered in my medical decision making (see chart for details).     37 year old female with mild lower abdominal and back pain.  On exam is benign.  No particular concerning "red flags" with regards to her back pain.  UA without signs of UTI.  She denies any unusual vaginal bleeding or  discharge.  Plan symptomatic treatment.  Over-the-counter bowel regimen as discussed.  Final Clinical Impressions(s) / ED Diagnoses   Final diagnoses:  Constipation, unspecified constipation type  Acute midline low back pain without sciatica    ED Discharge Orders         Ordered    meloxicam (MOBIC) 15 MG tablet  Daily PRN     07/26/18 1137           Virgel Manifold, MD 07/31/18 1106

## 2018-10-03 ENCOUNTER — Telehealth: Payer: Self-pay

## 2018-10-03 NOTE — Telephone Encounter (Signed)
Patient called in and would like to know if she can have a refill on her rx for flexiril and for ibuprofen.

## 2018-10-06 ENCOUNTER — Other Ambulatory Visit: Payer: Self-pay | Admitting: Obstetrics

## 2018-10-06 DIAGNOSIS — M545 Low back pain, unspecified: Secondary | ICD-10-CM

## 2018-10-06 DIAGNOSIS — G8929 Other chronic pain: Secondary | ICD-10-CM

## 2018-10-06 MED ORDER — IBUPROFEN 800 MG PO TABS: 800.0000 mg | ORAL_TABLET | Freq: Three times a day (TID) | ORAL | 5 refills | Status: DC | PRN

## 2018-10-06 MED ORDER — CYCLOBENZAPRINE HCL 10 MG PO TABS: 10.0000 mg | ORAL_TABLET | Freq: Three times a day (TID) | ORAL | 2 refills | Status: DC | PRN

## 2018-10-06 NOTE — Telephone Encounter (Signed)
Ibuprofen and Flexeril Rx

## 2019-03-03 ENCOUNTER — Other Ambulatory Visit: Payer: Self-pay

## 2019-03-03 MED ORDER — FLUCONAZOLE 150 MG PO TABS: 150.0000 mg | ORAL_TABLET | Freq: Once | ORAL | 1 refills | Status: AC

## 2019-03-03 NOTE — Progress Notes (Signed)
Pt called and states she is having vaginal irritation/itching and thick white discharge for the past couple days. Denies any odor. I advised patient that I could send her diflucan for yeast infection per protocol. Advised patient that if the medication does not clear up her symptoms that she will need to come in and be evaluated, pt verbalized understanding.

## 2020-01-25 ENCOUNTER — Ambulatory Visit: Payer: HRSA Program | Attending: Internal Medicine

## 2020-01-25 DIAGNOSIS — Z20822 Contact with and (suspected) exposure to covid-19: Secondary | ICD-10-CM | POA: Insufficient documentation

## 2020-01-26 LAB — NOVEL CORONAVIRUS, NAA: SARS-CoV-2, NAA: NOT DETECTED

## 2020-02-01 ENCOUNTER — Telehealth: Payer: Self-pay | Admitting: *Deleted

## 2020-02-01 ENCOUNTER — Other Ambulatory Visit: Payer: Self-pay | Admitting: Obstetrics

## 2020-02-01 DIAGNOSIS — G8929 Other chronic pain: Secondary | ICD-10-CM

## 2020-02-01 MED ORDER — IBUPROFEN 800 MG PO TABS: 800.0000 mg | ORAL_TABLET | Freq: Three times a day (TID) | ORAL | 5 refills | Status: DC | PRN

## 2020-02-01 MED ORDER — CYCLOBENZAPRINE HCL 10 MG PO TABS: 10.0000 mg | ORAL_TABLET | Freq: Three times a day (TID) | ORAL | 2 refills | Status: DC | PRN

## 2020-02-01 NOTE — Telephone Encounter (Signed)
Pt called to office for refill on Flexeril Rx. Pt made aware she has not been seen in almost 2 years- pt was scheduled for an AEX later this month.  Pt made aware message to be sent to provider for Rx approval.    Please send refill of Flexeril if approved.

## 2020-02-17 ENCOUNTER — Ambulatory Visit: Payer: Self-pay | Admitting: Obstetrics

## 2020-02-17 DIAGNOSIS — Z01419 Encounter for gynecological examination (general) (routine) without abnormal findings: Secondary | ICD-10-CM

## 2020-05-11 ENCOUNTER — Ambulatory Visit (HOSPITAL_COMMUNITY)
Admission: EM | Admit: 2020-05-11 | Discharge: 2020-05-11 | Disposition: A | Discharge status: Home / Self Care | Payer: Self-pay | Attending: Physician Assistant | Admitting: Physician Assistant

## 2020-05-11 ENCOUNTER — Other Ambulatory Visit: Payer: Self-pay

## 2020-05-11 ENCOUNTER — Encounter (HOSPITAL_COMMUNITY): Payer: Self-pay

## 2020-05-11 DIAGNOSIS — M25462 Effusion, left knee: Secondary | ICD-10-CM

## 2020-05-11 DIAGNOSIS — M25562 Pain in left knee: Secondary | ICD-10-CM

## 2020-05-11 MED ORDER — IBUPROFEN 800 MG PO TABS: 800.0000 mg | ORAL_TABLET | Freq: Three times a day (TID) | ORAL | 0 refills | Status: DC

## 2020-05-11 MED ORDER — DICLOFENAC SODIUM 1 % EX GEL: 4.0000 g | Freq: Four times a day (QID) | CUTANEOUS | 0 refills | Status: DC

## 2020-05-11 NOTE — Discharge Instructions (Signed)
For the next 4 days when she did take the ibuprofen 3 times a day.  Then use this as needed -You may also begin applying diclofenac after you have stopped the ibuprofen -Ice the knee and elevate it for the next 2 days -Limit the amount of single-leg squatting and bending that you are doing -Continue light aerobic exercise with light walks  Please establish with 1 of the primary care options  I have also supplied the sports medicine groups number 2 follow-up with if this is not improving over the next 2 or so weeks.  If you notice lots of redness and very large amount of swelling and pain, high fever or chills like for you to go to the emergency department or one of the orthopedic urgent cares in the area.  [facility] Open 8-8 [address], [place], [place], [place] [address]

## 2020-05-11 NOTE — ED Triage Notes (Signed)
C/o exacerbation of chronic left knee pain. Reports original injury 2 years ago, but reports this flare started yesterday morning. Reports pain, swelling, an inability to bend the knee yesterday. Pain and swelling still present today.

## 2020-05-11 NOTE — ED Provider Notes (Signed)
[facility]    CSN: [id number] Arrival date & time: 05/11/20  0805      History   Chief Complaint Chief Complaint  Patient presents with  . Knee Pain    HPI Anna Schroeder is a 39 y.o. female.   Patient with reported chronic left knee issues reports for acute exacerbation of left knee swelling and pain.  She reports yesterday the knee began to swell somewhat and was stiff.  She reports mild pain.  Denies significant instability.  She reports this occurred after walking around the house and denies any trips, falls or twisting.  She does report that she is seated for a while and stands up it hurts more.  She does report she exercises daily with unilateral squatting and bending exercises.  She reports 2 years ago she fell onto the left knee and ever since then the knee is giving her issues.  She never had evaluation at that time.  She reports she had a hematoma over the front of her knee then.  She reports she iced, rested and took over-the-counter medicines and this resolved after a few weeks.  She reports since then she has had intermittent knee swelling and pain.  Denies fevers and chills.     Past Medical History:  Diagnosis Date  . BV (bacterial vaginosis)   . Chlamydia   . Family history of anesthesia complication    parents were slow to wake up  . Fibroids   . Pelvic mass   . Vaginal yeast infection     Patient Active Problem List   Diagnosis Date Noted  . Endometriosis determined by laparoscopy 10/30/2014  . Pelvic pain in female 09/27/2014  . OBESITY, NOS 02/20/2007    Past Surgical History:  Procedure Laterality Date  . CESAREAN SECTION    . ROBOTIC ASSISTED TOTAL HYSTERECTOMY N/A 10/29/2014   Procedure: ROBOTIC ASSISTED TOTAL HYSTERECTOMY, LEFT SALPINGOOOPHERECTOMY, RIGHT SALPINGECTOMY;  Surgeon: Lahoma Crocker, MD;  Location: [facility];  Service: Gynecology;  Laterality: N/A;  . TUBAL LIGATION      OB History    Gravida  3   Para  3   Term    3   Preterm      AB      Living  3     SAB      TAB      Ectopic      Multiple      Live Births  3            Home Medications    Prior to Admission medications   Medication Sig Start Date End Date Taking? Authorizing Provider  acetaminophen (TYLENOL) 500 MG tablet Take 1 tablet (500 mg total) by mouth every 6 (six) hours as needed. Patient not taking: Reported on 05/07/2017 11/10/15   Gloriann Loan, PA-C  bismuth subsalicylate (PEPTO BISMOL) 262 MG/15ML suspension Take 30 mLs by mouth as needed for indigestion.    [provider]  cyclobenzaprine (FLEXERIL) 10 MG tablet Take 1 tablet (10 mg total) by mouth every 8 (eight) hours as needed for muscle spasms. 02/01/20   Shelly Bombard, MD  diclofenac Sodium (VOLTAREN) 1 % GEL Apply 4 g topically 4 (four) times daily. 05/11/20   Alexsis Branscom, Marguerita Beards, PA-C  fexofenadine (ALLEGRA) 60 MG tablet Take 60 mg by mouth 2 (two) times daily.    [provider]  fluticasone (FLONASE) 50 MCG/ACT nasal spray Place 2 sprays into both nostrils daily. Patient not taking:  Reported on 11/27/2017 10/16/17   Palumbo, April, MD  hydrocortisone cream 1 % Apply to affected area 2 times daily Patient not taking: Reported on 07/26/2018 02/01/18   Ocie Cornfield T, PA-C  ibuprofen (ADVIL) 800 MG tablet Take 1 tablet (800 mg total) by mouth 3 (three) times daily. 05/11/20   Irmalee Riemenschneider, Marguerita Beards, PA-C  meloxicam (MOBIC) 15 MG tablet Take 1 tablet (15 mg total) by mouth daily as needed for pain. 07/26/18   Virgel Manifold, MD  metroNIDAZOLE (FLAGYL) 500 MG tablet Take 1 tablet (500 mg total) by mouth 2 (two) times daily. Patient not taking: Reported on 07/26/2018 05/20/18   Shelly Bombard, MD    Family History Family History  Problem Relation Age of Onset  . Hypertension Mother   . Heart disease Mother   . Arthritis Mother   . Alcohol abuse Father   . Arthritis Father   . Asthma Father   . Diabetes Father   . Drug abuse Father   . Heart disease  Father   . Hyperlipidemia Father   . Hypertension Father   . Kidney disease Father   . Stroke Father     Social History Social History   Tobacco Use  . Smoking status: Former Smoker    Packs/day: 0.50    Types: Cigarettes    Quit date: 10/15/2015    Years since quitting: 4.5  . Smokeless tobacco: Never Used  Substance Use Topics  . Alcohol use: Yes    Comment: rare  . Drug use: Yes    Types: Marijuana    Comment: once per month     Allergies   Pineapple   Review of Systems Review of Systems   Physical Exam Triage Vital Signs ED Triage Vitals  Enc Vitals Group     BP      Pulse      Resp      Temp      Temp src      SpO2      Weight      Height      Head Circumference      Peak Flow      Pain Score      Pain Loc      Pain Edu?      Excl. in [place]?    No data found.  Updated Vital Signs BP (!) 142/97 (BP Location: Right Arm)   Pulse 85   Temp 98.6 F (37 C) (Oral)   Resp 16   LMP 10/17/2014   SpO2 97%   Visual Acuity Right Eye Distance:   Left Eye Distance:   Bilateral Distance:    Right Eye Near:   Left Eye Near:    Bilateral Near:     Physical Exam Vitals and nursing note reviewed.  Constitutional:      General: She is not in acute distress.    Appearance: She is well-developed.  HENT:     Head: Normocephalic and atraumatic.  Eyes:     Conjunctiva/sclera: Conjunctivae normal.  Cardiovascular:     Rate and Rhythm: Normal rate.  Pulmonary:     Effort: Pulmonary effort is normal. No respiratory distress.  Musculoskeletal:     Cervical back: Neck supple.     Right knee: Normal.     Left knee: Effusion (superior to patella) present. No swelling, deformity, erythema, ecchymosis or lacerations. Normal range of motion. Tenderness (with compression of patella) present over the medial joint line and patellar tendon. No  LCL laxity, MCL laxity, ACL laxity or PCL laxity.Normal alignment and normal patellar mobility.  Skin:    General: Skin is  warm and dry.  Neurological:     Mental Status: She is alert.      UC Treatments / Results  Labs (all labs ordered are listed, but only abnormal results are displayed) Labs Reviewed - No data to display  EKG   Radiology No results found.  Procedures Procedures (including critical care time)  Medications Ordered in UC Medications - No data to display  Initial Impression / Assessment and Plan / UC Course  I have reviewed the triage vital signs and the nursing notes.  Pertinent labs & imaging results that were available during my care of the patient were reviewed by me and considered in my medical decision making (see chart for details).     #Patellofemoral pain #Knee effusion Patient is a 39 year old presenting with symptoms consistent with a patellofemoral syndrome with a knee effusion secondary to this.  Suspect there is some possible aspect of arthritic changes.  Given type of exercise she has been conducting there may also be chronic meniscal injuries.  Currently have low suspicion for septic joint.  We will treat her with NSAIDs, diclofenac gel, ice and elevation and have her follow-up with sports medicine for further evaluation.  We discussed signs of acutely worsening joint and that she should follow-up either in the emergency department or with one of the orthopedic urgent cares in the area.  She does verbalize understanding plan.  Final Clinical Impressions(s) / UC Diagnoses   Final diagnoses:  Patellofemoral arthralgia of left knee  Effusion of left knee     Discharge Instructions     For the next 4 days when she did take the ibuprofen 3 times a day.  Then use this as needed -You may also begin applying diclofenac after you have stopped the ibuprofen -Ice the knee and elevate it for the next 2 days -Limit the amount of single-leg squatting and bending that you are doing -Continue light aerobic exercise with light walks  Please establish with 1 of the primary  care options  I have also supplied the sports medicine groups number 2 follow-up with if this is not improving over the next 2 or so weeks.  If you notice lots of redness and very large amount of swelling and pain, high fever or chills like for you to go to the emergency department or one of the orthopedic urgent cares in the area.  [facility] Open 8[facility]8 [address], [place], [place], [place] [address]     ED Prescriptions    Medication Sig Dispense Auth. Provider   ibuprofen (ADVIL) 800 MG tablet Take 1 tablet (800 mg total) by mouth 3 (three) times daily. 21 tablet Quenna Doepke, Marguerita Beards, PA-C   diclofenac Sodium (VOLTAREN) 1 % GEL Apply 4 g topically 4 (four) times daily. 100 g Lowanda Cashaw, Marguerita Beards, PA-C     PDMP not reviewed this encounter.   Purnell Shoemaker, PA-C 05/12/20 [phone]

## 2020-08-12 ENCOUNTER — Telehealth: Payer: Self-pay

## 2020-08-12 NOTE — Telephone Encounter (Signed)
Returned call, pt requesting rx for yeast, advised has not been seen since 2019, transferred to scheduler for annual appt.

## 2020-08-31 ENCOUNTER — Other Ambulatory Visit (HOSPITAL_COMMUNITY)
Admission: RE | Admit: 2020-08-31 | Discharge: 2020-08-31 | Disposition: A | Discharge status: Home / Self Care | Payer: Commercial Managed Care - PPO | Source: Ambulatory Visit | Attending: Obstetrics | Admitting: Obstetrics

## 2020-08-31 ENCOUNTER — Ambulatory Visit (INDEPENDENT_AMBULATORY_CARE_PROVIDER_SITE_OTHER): Payer: Commercial Managed Care - PPO | Admitting: Obstetrics

## 2020-08-31 ENCOUNTER — Other Ambulatory Visit: Payer: Self-pay

## 2020-08-31 ENCOUNTER — Encounter: Payer: Self-pay | Admitting: Obstetrics

## 2020-08-31 VITALS — BP 137/87 | HR 80 | Ht 62.0 in | Wt 146.7 lb

## 2020-08-31 DIAGNOSIS — Z1239 Encounter for other screening for malignant neoplasm of breast: Secondary | ICD-10-CM | POA: Diagnosis not present

## 2020-08-31 DIAGNOSIS — Z01419 Encounter for gynecological examination (general) (routine) without abnormal findings: Secondary | ICD-10-CM | POA: Diagnosis not present

## 2020-08-31 DIAGNOSIS — N898 Other specified noninflammatory disorders of vagina: Secondary | ICD-10-CM | POA: Insufficient documentation

## 2020-08-31 NOTE — Progress Notes (Addendum)
Subjective:        Anna Schroeder is a 39 y.o. female here for a routine exam.  Current complaints: Thick-white vaginal discharge.    Personal health questionnaire:  Is patient Ashkenazi Jewish, have a family history of breast and/or ovarian cancer: no Is there a family history of uterine cancer diagnosed at age < 27, gastrointestinal cancer, urinary tract cancer, family member who is a Field seismologist syndrome-associated carrier: no Is the patient overweight and hypertensive, family history of diabetes, personal history of gestational diabetes, preeclampsia or PCOS: no Is patient over 56, have PCOS,  family history of premature CHD under age 4, diabetes, smoke, have hypertension or peripheral artery disease:  no At any time, has a partner hit, kicked or otherwise hurt or frightened you?: no Over the past 2 weeks, have you felt down, depressed or hopeless?: no Over the past 2 weeks, have you felt little interest or pleasure in doing things?:no   Gynecologic History Patient's last menstrual period was 10/17/2014. Contraception: status post hysterectomy Last Pap: 2015. Results were: normal Last mammogram: n/a. Results were: n/a  Obstetric History OB History  Gravida Para Term Preterm AB Living  3 3 3     3   SAB TAB Ectopic Multiple Live Births          3    # Outcome Date GA Lbr Len/2nd Weight Sex Delivery Anes PTL Lv  3 Term      Vag-Spont   LIV  2 Term      CS-LTranv   LIV  1 Term      Vag-Spont   LIV    Past Medical History:  Diagnosis Date  . BV (bacterial vaginosis)   . Chlamydia   . Family history of anesthesia complication    parents were slow to wake up  . Fibroids   . Pelvic mass   . Vaginal yeast infection     Past Surgical History:  Procedure Laterality Date  . CESAREAN SECTION    . ROBOTIC ASSISTED TOTAL HYSTERECTOMY N/A 10/29/2014   Procedure: ROBOTIC ASSISTED TOTAL HYSTERECTOMY, LEFT SALPINGOOOPHERECTOMY, RIGHT SALPINGECTOMY;  Surgeon: Lahoma Crocker, MD;   Location: [facility];  Service: Gynecology;  Laterality: N/A;  . TUBAL LIGATION       Current Outpatient Medications:  .  acetaminophen (TYLENOL) 500 MG tablet, Take 1 tablet (500 mg total) by mouth every 6 (six) hours as needed. (Patient not taking: Reported on 05/07/2017), Disp: 30 tablet, Rfl: 0 .  bismuth subsalicylate (PEPTO BISMOL) 262 MG/15ML suspension, Take 30 mLs by mouth as needed for indigestion. (Patient not taking: Reported on 08/31/2020), Disp: , Rfl:  .  cyclobenzaprine (FLEXERIL) 10 MG tablet, Take 1 tablet (10 mg total) by mouth every 8 (eight) hours as needed for muscle spasms. (Patient not taking: Reported on 08/31/2020), Disp: 30 tablet, Rfl: 2 .  diclofenac Sodium (VOLTAREN) 1 % GEL, Apply 4 g topically 4 (four) times daily. (Patient not taking: Reported on 08/31/2020), Disp: 100 g, Rfl: 0 .  fexofenadine (ALLEGRA) 60 MG tablet, Take 60 mg by mouth 2 (two) times daily. (Patient not taking: Reported on 08/31/2020), Disp: , Rfl:  .  fluticasone (FLONASE) 50 MCG/ACT nasal spray, Place 2 sprays into both nostrils daily. (Patient not taking: Reported on 11/27/2017), Disp: 16 g, Rfl: 0 .  hydrocortisone cream 1 %, Apply to affected area 2 times daily (Patient not taking: Reported on 07/26/2018), Disp: 15 g, Rfl: 0 .  ibuprofen (ADVIL) 800 MG tablet, Take 1  tablet (800 mg total) by mouth 3 (three) times daily. (Patient not taking: Reported on 08/31/2020), Disp: 21 tablet, Rfl: 0 .  meloxicam (MOBIC) 15 MG tablet, Take 1 tablet (15 mg total) by mouth daily as needed for pain. (Patient not taking: Reported on 08/31/2020), Disp: 10 tablet, Rfl: 0 .  metroNIDAZOLE (FLAGYL) 500 MG tablet, Take 1 tablet (500 mg total) by mouth 2 (two) times daily. (Patient not taking: Reported on 07/26/2018), Disp: 14 tablet, Rfl: 2 Allergies  Allergen Reactions  . Pineapple Other (See Comments)    Tongue tingles    Social History   Tobacco Use  . Smoking status: Former Smoker    Packs/day: 0.50    Types: Cigarettes     Quit date: 10/15/2015    Years since quitting: 4.8  . Smokeless tobacco: Never Used  Substance Use Topics  . Alcohol use: Yes    Comment: rare    Family History  Problem Relation Age of Onset  . Hypertension Mother   . Heart disease Mother   . Arthritis Mother   . Alcohol abuse Father   . Arthritis Father   . Asthma Father   . Diabetes Father   . Drug abuse Father   . Heart disease Father   . Hyperlipidemia Father   . Hypertension Father   . Kidney disease Father   . Stroke Father       Review of Systems  Constitutional: negative for fatigue and weight loss Respiratory: negative for cough and wheezing Cardiovascular: negative for chest pain, fatigue and palpitations Gastrointestinal: negative for abdominal pain and change in bowel habits Musculoskeletal:negative for myalgias Neurological: negative for gait problems and tremors Behavioral/Psych: negative for abusive relationship, depression Endocrine: negative for temperature intolerance    Genitourinary:negative for abnormal menstrual periods, genital lesions, hot flashes, sexual problems.  Positive for vaginal discharge Integument/breast: negative for breast lump, breast tenderness, nipple discharge and skin lesion(s)    Objective:       BP 137/87   Pulse 80   Ht 5\' 2"  (1.575 m)   Wt 146 lb 11.2 oz (66.5 kg)   LMP 10/17/2014   BMI 26.83 kg/m  General:   alert and no distress  Skin:   no rash or abnormalities  Lungs:   clear to auscultation bilaterally  Heart:   regular rate and rhythm, S1, S2 normal, no murmur, click, rub or gallop  Breasts:   normal without suspicious masses, skin or nipple changes or axillary nodes  Abdomen:  normal findings: no organomegaly, soft, non-tender and no hernia  Pelvis:  External genitalia: normal general appearance Urinary system: urethral meatus normal and bladder without fullness, nontender Vaginal: normal without tenderness, induration or masses Cervix: absent Adnexa:  normal bimanual exam Uterus: absent   Lab Review Urine pregnancy test Labs reviewed yes Radiologic studies reviewed no  50% of 20 min visit spent on counseling and coordination of care.   Assessment:     1. Encounter for gynecological examination Rx: - Cytology - PAP( [facility])  2. Vaginal discharge Rx: - Cervicovaginal ancillary only( [place])  3. Screening breast examination Rx: - MM Digital Screening; Future    Plan:    Education reviewed: calcium supplements, depression evaluation, low fat, low cholesterol diet, safe sex/STD prevention, self breast exams and weight bearing exercise. Mammogram ordered. Follow up in: 1 year.     Shelly Bombard, MD 08/31/2020 8:38 AM

## 2020-08-31 NOTE — Progress Notes (Signed)
Pt is in the office for annual exam. Pt has not had a mammogram Last pap 2015 Pt complains of thick, white discharge.  GAD-7=7

## 2020-09-05 ENCOUNTER — Telehealth: Payer: Self-pay

## 2020-09-05 ENCOUNTER — Other Ambulatory Visit: Payer: Self-pay | Admitting: Obstetrics

## 2020-09-05 DIAGNOSIS — N760 Acute vaginitis: Secondary | ICD-10-CM

## 2020-09-05 LAB — CERVICOVAGINAL ANCILLARY ONLY
Bacterial Vaginitis (gardnerella): POSITIVE — AB
Candida Glabrata: NEGATIVE
Candida Vaginitis: NEGATIVE
Chlamydia: NEGATIVE
Comment: NEGATIVE
Comment: NEGATIVE
Comment: NEGATIVE
Comment: NEGATIVE
Comment: NEGATIVE
Comment: NORMAL
Neisseria Gonorrhea: NEGATIVE
Trichomonas: NEGATIVE

## 2020-09-05 MED ORDER — METRONIDAZOLE 500 MG PO TABS: 500.0000 mg | ORAL_TABLET | Freq: Two times a day (BID) | ORAL | 2 refills | Status: DC

## 2020-09-05 NOTE — Telephone Encounter (Signed)
Patient inform of BV test results and the need to take flagyl to help with infection.

## 2020-09-05 NOTE — Telephone Encounter (Signed)
-----   Message from Shelly Bombard, MD sent at 09/05/2020  1:27 PM EDT ----- Flagyl Rx for BV

## 2020-09-15 ENCOUNTER — Other Ambulatory Visit: Payer: Self-pay

## 2020-09-15 ENCOUNTER — Ambulatory Visit
Admission: RE | Admit: 2020-09-15 | Discharge: 2020-09-15 | Disposition: A | Discharge status: Home / Self Care | Payer: Commercial Managed Care - PPO | Source: Ambulatory Visit | Attending: Obstetrics | Admitting: Obstetrics

## 2020-09-15 DIAGNOSIS — Z1239 Encounter for other screening for malignant neoplasm of breast: Secondary | ICD-10-CM

## 2021-02-13 ENCOUNTER — Other Ambulatory Visit: Payer: Self-pay | Admitting: Obstetrics

## 2021-02-13 DIAGNOSIS — M545 Low back pain, unspecified: Secondary | ICD-10-CM

## 2021-07-01 ENCOUNTER — Encounter (HOSPITAL_COMMUNITY): Payer: Self-pay | Admitting: Emergency Medicine

## 2021-07-01 ENCOUNTER — Emergency Department (HOSPITAL_COMMUNITY): Payer: Self-pay

## 2021-07-01 ENCOUNTER — Emergency Department (HOSPITAL_COMMUNITY)
Admission: EM | Admit: 2021-07-01 | Discharge: 2021-07-01 | Disposition: A | Discharge status: Home / Self Care | Payer: Self-pay | Attending: Emergency Medicine | Admitting: Emergency Medicine

## 2021-07-01 ENCOUNTER — Other Ambulatory Visit: Payer: Self-pay

## 2021-07-01 DIAGNOSIS — M25562 Pain in left knee: Secondary | ICD-10-CM | POA: Insufficient documentation

## 2021-07-01 DIAGNOSIS — X500XXA Overexertion from strenuous movement or load, initial encounter: Secondary | ICD-10-CM | POA: Insufficient documentation

## 2021-07-01 DIAGNOSIS — Z87891 Personal history of nicotine dependence: Secondary | ICD-10-CM | POA: Insufficient documentation

## 2021-07-01 DIAGNOSIS — Y92009 Unspecified place in unspecified non-institutional (private) residence as the place of occurrence of the external cause: Secondary | ICD-10-CM | POA: Insufficient documentation

## 2021-07-01 DIAGNOSIS — G8929 Other chronic pain: Secondary | ICD-10-CM

## 2021-07-01 MED ORDER — IBUPROFEN 600 MG PO TABS: 600.0000 mg | ORAL_TABLET | Freq: Four times a day (QID) | ORAL | 0 refills | Status: DC | PRN

## 2021-07-01 NOTE — ED Provider Notes (Signed)
[facility] EMERGENCY DEPARTMENT Provider Note   CSN: [id number] Arrival date & time: 07/01/21  1427     History No chief complaint on file.   Anna Schroeder is a 40 y.o. female.  40 year old female with prior medical history as detailed below presents for evaluation.  Patient complains of longstanding history of pain to the left knee.  Patient reports that she has been having pain to this knee on and off for at least 2 to 3 years.  She reports increased pain over the last week.  She denies any specific injury.  She has been moving and lifting heavy objects at home.  She denies fever.  She is able to ambulate without difficulty.  Her pain is improved with ibuprofen use.  The history is provided by the patient and medical records.  Knee Pain Location:  Knee Injury: no   Knee location:  L knee Pain details:    Quality:  Aching   Radiates to:  Does not radiate   Severity:  Moderate   Onset quality:  Gradual   Timing:  Intermittent   Progression:  Waxing and waning Chronicity:  Chronic     Past Medical History:  Diagnosis Date   BV (bacterial vaginosis)    Chlamydia    Family history of anesthesia complication    parents were slow to wake up   Fibroids    Pelvic mass    Vaginal yeast infection     Patient Active Problem List   Diagnosis Date Noted   Endometriosis determined by laparoscopy 10/30/2014   Pelvic pain in female 09/27/2014   OBESITY, NOS 02/20/2007    Past Surgical History:  Procedure Laterality Date   CESAREAN SECTION     ROBOTIC ASSISTED TOTAL HYSTERECTOMY N/A 10/29/2014   Procedure: ROBOTIC ASSISTED TOTAL HYSTERECTOMY, LEFT SALPINGOOOPHERECTOMY, RIGHT SALPINGECTOMY;  Surgeon: Lahoma Crocker, MD;  Location: [facility];  Service: Gynecology;  Laterality: N/A;   TUBAL LIGATION       OB History     Gravida  3   Para  3   Term  3   Preterm      AB      Living  3      SAB      IAB      Ectopic      Multiple       Live Births  3           Family History  Problem Relation Age of Onset   Hypertension Mother    Heart disease Mother    Arthritis Mother    Alcohol abuse Father    Arthritis Father    Asthma Father    Diabetes Father    Drug abuse Father    Heart disease Father    Hyperlipidemia Father    Hypertension Father    Kidney disease Father    Stroke Father     Social History   Tobacco Use   Smoking status: Former    Packs/day: 0.50    Pack years: 0.00    Types: Cigarettes, Cigars    Quit date: 10/15/2015    Years since quitting: 5.7   Smokeless tobacco: Never  Vaping Use   Vaping Use: Never used  Substance Use Topics   Alcohol use: Yes    Comment: rare   Drug use: Yes    Types: Marijuana    Comment: once per month    Home Medications Prior to Admission medications  Medication Sig Start Date End Date Taking? Authorizing Provider  ibuprofen (ADVIL) 600 MG tablet Take 1 tablet (600 mg total) by mouth every 6 (six) hours as needed. 07/01/21  Yes Valarie Merino, MD  acetaminophen (TYLENOL) 500 MG tablet Take 1 tablet (500 mg total) by mouth every 6 (six) hours as needed. Patient not taking: Reported on 05/07/2017 11/10/15   Gloriann Loan, PA-C  bismuth subsalicylate (PEPTO BISMOL) 262 MG/15ML suspension Take 30 mLs by mouth as needed for indigestion. Patient not taking: Reported on 08/31/2020    [provider]  cyclobenzaprine (FLEXERIL) 10 MG tablet TAKE 1 TABLET (10 MG TOTAL) BY MOUTH EVERY 8 (EIGHT) HOURS AS NEEDED FOR MUSCLE SPASMS. 02/13/21   Shelly Bombard, MD  diclofenac Sodium (VOLTAREN) 1 % GEL Apply 4 g topically 4 (four) times daily. Patient not taking: Reported on 08/31/2020 05/11/20   Darr, Edison Nasuti, PA-C  fexofenadine (ALLEGRA) 60 MG tablet Take 60 mg by mouth 2 (two) times daily. Patient not taking: Reported on 08/31/2020    [provider]  fluticasone (FLONASE) 50 MCG/ACT nasal spray Place 2 sprays into both nostrils daily. Patient not taking:  Reported on 11/27/2017 10/16/17   Palumbo, April, MD  hydrocortisone cream 1 % Apply to affected area 2 times daily Patient not taking: Reported on 07/26/2018 02/01/18   Ocie Cornfield T, PA-C  ibuprofen (ADVIL) 800 MG tablet Take 1 tablet (800 mg total) by mouth 3 (three) times daily. Patient not taking: Reported on 08/31/2020 05/11/20   Darr, Edison Nasuti, PA-C  meloxicam (MOBIC) 15 MG tablet Take 1 tablet (15 mg total) by mouth daily as needed for pain. Patient not taking: Reported on 08/31/2020 07/26/18   Virgel Manifold, MD  metroNIDAZOLE (FLAGYL) 500 MG tablet Take 1 tablet (500 mg total) by mouth 2 (two) times daily. 09/05/20   Shelly Bombard, MD    Allergies    Pineapple  Review of Systems   Review of Systems  All other systems reviewed and are negative.  Physical Exam Updated Vital Signs BP (!) 159/98   Pulse 76   Temp 98.4 F (36.9 C)   Resp 18   LMP 10/17/2014   SpO2 100%   Physical Exam Vitals and nursing note reviewed.  Constitutional:      General: She is not in acute distress.    Appearance: Normal appearance. She is well-developed.  HENT:     Head: Normocephalic and atraumatic.  Eyes:     Conjunctiva/sclera: Conjunctivae normal.     Pupils: Pupils are equal, round, and reactive to light.  Cardiovascular:     Rate and Rhythm: Normal rate and regular rhythm.     Heart sounds: Normal heart sounds.  Pulmonary:     Effort: Pulmonary effort is normal. No respiratory distress.     Breath sounds: Normal breath sounds.  Abdominal:     General: There is no distension.     Palpations: Abdomen is soft.     Tenderness: There is no abdominal tenderness.  Musculoskeletal:        General: No deformity. Normal range of motion.     Cervical back: Normal range of motion and neck supple.     Comments: Mild diffuse tenderness to the anterior aspect of the left knee.  No discrete erythema or evidence of cellulitis noted.  No significant effusion noted.  Knee is stable.  Distal left lower  extremity is neurovascular intact.  Skin:    General: Skin is warm and dry.  Neurological:  General: No focal deficit present.     Mental Status: She is alert and oriented to person, place, and time.    ED Results / Procedures / Treatments   Labs (all labs ordered are listed, but only abnormal results are displayed) Labs Reviewed - No data to display  EKG None  Radiology DG Knee Complete 4 Views Left  Result Date: 07/01/2021 CLINICAL DATA:  Chronic right knee pain. EXAM: LEFT KNEE - COMPLETE 4+ VIEW COMPARISON:  None. FINDINGS: There is no acute bony or joint abnormality. No focal bony lesion. No joint effusion. Joint spaces are preserved. Small osteophytes are seen about the knee. There is some spurring off the superior and inferior poles of the patella at the quadriceps and patellar tendon attachment sites. No chondrocalcinosis. Soft tissues are negative IMPRESSION: No acute abnormality. Mild appearing osteoarthritis. Electronically Signed   By: Inge Rise M.D.   On: 07/01/2021 15:59    Procedures Procedures   Medications Ordered in ED Medications - No data to display  ED Course  I have reviewed the triage vital signs and the nursing notes.  Pertinent labs & imaging results that were available during my care of the patient were reviewed by me and considered in my medical decision making (see chart for details).    MDM Rules/Calculators/A&P                          MDM  MSE complete  Anna Schroeder was evaluated in Emergency Department on 07/01/2021 for the symptoms described in the history of present illness. She was evaluated in the context of the global COVID-19 pandemic, which necessitated consideration that the patient might be at risk for infection with the SARS-CoV-2 virus that causes COVID-19. Institutional protocols and algorithms that pertain to the evaluation of patients at risk for COVID-19 are in a state of rapid change based on information released by  regulatory bodies including the CDC and federal and state organizations. These policies and algorithms were followed during the patient's care in the ED.  Patient is presenting with complaint of left knee pain.  Patient's describe symptoms are chronic in nature.  Patient with history consistent with likely early osteoarthritis.  Patient's x-ray does not demonstrate acute pathology.  There is evidence of early osteoarthritis on x-ray.  Patient understands need for close follow-up.  Strict return precautions given and understood.  She understands appropriate outpatient management of her symptoms.   Final Clinical Impression(s) / ED Diagnoses Final diagnoses:  Chronic pain of left knee    Rx / DC Orders ED Discharge Orders          Ordered    ibuprofen (ADVIL) 600 MG tablet  Every 6 hours PRN        07/01/21 1611             Valarie Merino, MD 07/01/21 [phone]

## 2021-07-01 NOTE — Discharge Instructions (Addendum)
Return for any problem.  ?

## 2021-07-01 NOTE — ED Triage Notes (Signed)
Pt reports L knee pain and swelling since 2020.  States she hit it on wall on 7/2.

## 2021-07-10 ENCOUNTER — Ambulatory Visit: Payer: Self-pay

## 2021-07-10 NOTE — Telephone Encounter (Signed)
Pt. Reports she was seen in ED 07/01/21 and has an appointment with orthopedics 07/17/21. Denies need to be triaged at this time.

## 2021-08-02 ENCOUNTER — Ambulatory Visit (HOSPITAL_BASED_OUTPATIENT_CLINIC_OR_DEPARTMENT_OTHER): Payer: Self-pay | Admitting: Family Medicine

## 2021-08-17 ENCOUNTER — Other Ambulatory Visit: Payer: Self-pay

## 2021-08-17 ENCOUNTER — Ambulatory Visit (INDEPENDENT_AMBULATORY_CARE_PROVIDER_SITE_OTHER): Payer: Medicaid Other | Admitting: Primary Care

## 2021-08-17 ENCOUNTER — Encounter (INDEPENDENT_AMBULATORY_CARE_PROVIDER_SITE_OTHER): Payer: Self-pay | Admitting: Primary Care

## 2021-08-17 VITALS — BP 132/88 | HR 75 | Temp 97.5°F | Ht 62.0 in | Wt 146.8 lb

## 2021-08-17 DIAGNOSIS — M25562 Pain in left knee: Secondary | ICD-10-CM | POA: Diagnosis not present

## 2021-08-17 DIAGNOSIS — Z131 Encounter for screening for diabetes mellitus: Secondary | ICD-10-CM | POA: Diagnosis not present

## 2021-08-17 DIAGNOSIS — Z09 Encounter for follow-up examination after completed treatment for conditions other than malignant neoplasm: Secondary | ICD-10-CM | POA: Diagnosis not present

## 2021-08-17 DIAGNOSIS — J00 Acute nasopharyngitis [common cold]: Secondary | ICD-10-CM

## 2021-08-17 DIAGNOSIS — G8929 Other chronic pain: Secondary | ICD-10-CM | POA: Diagnosis not present

## 2021-08-17 DIAGNOSIS — R030 Elevated blood-pressure reading, without diagnosis of hypertension: Secondary | ICD-10-CM | POA: Diagnosis not present

## 2021-08-17 DIAGNOSIS — Z7689 Persons encountering health services in other specified circumstances: Secondary | ICD-10-CM | POA: Diagnosis not present

## 2021-08-17 LAB — POCT GLYCOSYLATED HEMOGLOBIN (HGB A1C): Hemoglobin A1C: 4.8 % (ref 4.0–5.6)

## 2021-08-17 NOTE — Progress Notes (Signed)
[facility]   Subjective:  [name] is a 40 y.o. female presents for Emergency room follow up and establish care. Presented to ED for having pain to this knee on and off for at least 2 to 3 years.  She reports increased pain over the last week. She has been moving and lifting heavy objects at home in July. Pain returned last week and hit her knee bruise is present lifting a chair going up the stairs.  She called  Erle Crocker, MD (Orthopedic Surgery)and was told they do not accept her insurance. She is also concern with having a sore throat, congestion and pressure above her nostril. She denies fever or chills. She took a home COVID and was negative. Called [facility] with this information and told to keep her appointment today.    Past Medical History:  Diagnosis Date   BV (bacterial vaginosis)    Chlamydia    Family history of anesthesia complication    parents were slow to wake up   Fibroids    Pelvic mass    Vaginal yeast infection      Allergies  Allergen Reactions   Pineapple Other (See Comments)    Tongue tingles      Current Outpatient Medications on File Prior to Visit  Medication Sig Dispense Refill   acetaminophen (TYLENOL) 500 MG tablet Take 1 tablet (500 mg total) by mouth every 6 (six) hours as needed. (Patient not taking: Reported on 05/07/2017) 30 tablet 0   bismuth subsalicylate (PEPTO BISMOL) 262 MG/15ML suspension Take 30 mLs by mouth as needed for indigestion. (Patient not taking: Reported on 08/31/2020)     cyclobenzaprine (FLEXERIL) 10 MG tablet TAKE 1 TABLET (10 MG TOTAL) BY MOUTH EVERY 8 (EIGHT) HOURS AS NEEDED FOR MUSCLE SPASMS. 30 tablet 2   diclofenac Sodium (VOLTAREN) 1 % GEL Apply 4 g topically 4 (four) times daily. (Patient not taking: Reported on 08/31/2020) 100 g 0   fexofenadine (ALLEGRA) 60 MG tablet Take 60 mg by mouth 2 (two) times daily. (Patient not taking: Reported on 08/31/2020)     fluticasone (FLONASE) 50 MCG/ACT nasal  spray Place 2 sprays into both nostrils daily. (Patient not taking: Reported on 11/27/2017) 16 g 0   hydrocortisone cream 1 % Apply to affected area 2 times daily (Patient not taking: Reported on 07/26/2018) 15 g 0   ibuprofen (ADVIL) 600 MG tablet Take 1 tablet (600 mg total) by mouth every 6 (six) hours as needed. 30 tablet 0   ibuprofen (ADVIL) 800 MG tablet Take 1 tablet (800 mg total) by mouth 3 (three) times daily. (Patient not taking: Reported on 08/31/2020) 21 tablet 0   meloxicam (MOBIC) 15 MG tablet Take 1 tablet (15 mg total) by mouth daily as needed for pain. (Patient not taking: Reported on 08/31/2020) 10 tablet 0   metroNIDAZOLE (FLAGYL) 500 MG tablet Take 1 tablet (500 mg total) by mouth 2 (two) times daily. 14 tablet 2   No current facility-administered medications on file prior to visit.     Review of System: Review of Systems  HENT:  Positive for congestion and sore throat.   Musculoskeletal:        Increase knee pain walking and bending. Unable to straighten leg out without increase pain  All other systems reviewed and are negative.  Objective:  Ht '5\' 2"'$  (1.575 m)   LMP 10/17/2014   BMI 26.83 kg/m   There were no vitals filed for this visit.  Physical  Exam: General Appearance: Well nourished, in no apparent distress. Eyes: PERRLA, EOMs, conjunctiva no swelling or erythema Sinuses: Frontal no tenderness but maxillary tenderness ENT/Mouth: Ext aud canals clear, TMs without erythema, bulging. Tonsils not swollen or erythematous. Hearing normal.  Neck: Supple, thyroid normal.  Respiratory: Respiratory effort normal, BS equal bilaterally without rales, rhonchi, wheezing or stridor.  Cardio: RRR with no MRGs. Brisk peripheral pulses without edema.  Abdomen: Soft, + BS.  Non tender, no guarding, rebound, hernias, masses. Lymphatics: Non tender without lymphadenopathy.  Musculoskeletal: Full ROM, 5/5 strength, normal gait.  Skin: Warm, dry without rashes, lesions, ecchymosis.   Neuro: Cranial nerves intact. Normal muscle tone, no cerebellar symptoms. Sensation intact.  Psych: Awake and oriented X 3, normal affect, Insight and Judgment appropriate.   Assessment:   Anna Schroeder was seen today for hospitalization follow-up and referral.  Diagnoses and all orders for this visit:  Encounter to establish care Establishing care with PCP  Chronic pain of left knee Secondary to injury and lifting heavy items. Imaging Small osteophytes are seen about the knee. There is some spurring off the superior and inferior poles of the patella at the quadriceps and patellar tendon attachment sites. With mild appearing osteoarthritis. Refer to [facility] discharge follow-up Retrieved from discharge Follow up with Primary Care at [facility]); call and set up promary care doctor Call [place] [facility] in 3 days (07/04/2021)- establish care at [facility] Call Erle Crocker, MD (Orthopedic Surgery) in 3 days (07/04/2021) would not take her insurance refer to another orthopedics   Elevated blood pressure reading without diagnosis of hypertension Bp in ED elevated and > 130/80 at today's visit discussed counseled on blood pressure goal of less than 130/80, low-sodium, DASH diet, medication compliance, 150 minutes of moderate intensity exercise per week. AVS given how to prevent HTN    Common cold Advised to recheck COVID test in 3-4 days with [facility] mobile unit if symptoms persist. Especially fever, chills, body aches , loss of taste or smell .  Maxillary tenderness no erythremia seen in throat. Start back taking Claritin daily (OTC)  Diabetes mellitus screening father , maternal grandmother  and brother have diabetes  A1C 4.8 per guidelines not a diabetic  This note has been created with Surveyor, quantity. Any transcriptional errors are unintentional.   Kerin Perna, NP 08/17/2021,  1:45 PM

## 2021-08-17 NOTE — Patient Instructions (Signed)

## 2021-09-01 ENCOUNTER — Other Ambulatory Visit (HOSPITAL_COMMUNITY)
Admission: RE | Admit: 2021-09-01 | Discharge: 2021-09-01 | Disposition: A | Discharge status: Home / Self Care | Payer: Medicaid Other | Source: Ambulatory Visit | Attending: Obstetrics | Admitting: Obstetrics

## 2021-09-01 ENCOUNTER — Ambulatory Visit (INDEPENDENT_AMBULATORY_CARE_PROVIDER_SITE_OTHER): Payer: Medicaid Other | Admitting: Obstetrics

## 2021-09-01 ENCOUNTER — Encounter: Payer: Self-pay | Admitting: Obstetrics

## 2021-09-01 ENCOUNTER — Other Ambulatory Visit: Payer: Self-pay

## 2021-09-01 VITALS — BP 130/81 | HR 78 | Wt 144.0 lb

## 2021-09-01 DIAGNOSIS — Z1239 Encounter for other screening for malignant neoplasm of breast: Secondary | ICD-10-CM

## 2021-09-01 DIAGNOSIS — Z90710 Acquired absence of both cervix and uterus: Secondary | ICD-10-CM

## 2021-09-01 DIAGNOSIS — Z01419 Encounter for gynecological examination (general) (routine) without abnormal findings: Secondary | ICD-10-CM | POA: Diagnosis not present

## 2021-09-01 DIAGNOSIS — N898 Other specified noninflammatory disorders of vagina: Secondary | ICD-10-CM | POA: Insufficient documentation

## 2021-09-01 DIAGNOSIS — Z113 Encounter for screening for infections with a predominantly sexual mode of transmission: Secondary | ICD-10-CM | POA: Diagnosis not present

## 2021-09-01 MED ORDER — METRONIDAZOLE 500 MG PO TABS: 500.0000 mg | ORAL_TABLET | Freq: Two times a day (BID) | ORAL | 2 refills | Status: DC

## 2021-09-01 NOTE — Addendum Note (Signed)
Addended by: Baltazar Najjar A on: 09/01/2021 09:57 AM   Modules accepted: Orders

## 2021-09-01 NOTE — Progress Notes (Addendum)
Subjective:        Anna Schroeder is a 40 y.o. female here for a routine exam.  Current complaints: Malodorous vaginal discharge.    Personal health questionnaire:  Is patient Ashkenazi Jewish, have a family history of breast and/or ovarian cancer: no Is there a family history of uterine cancer diagnosed at age < 39, gastrointestinal cancer, urinary tract cancer, family member who is a Field seismologist syndrome-associated carrier: no Is the patient overweight and hypertensive, family history of diabetes, personal history of gestational diabetes, preeclampsia or PCOS: no Is patient over 41, have PCOS,  family history of premature CHD under age 30, diabetes, smoke, have hypertension or peripheral artery disease:  no At any time, has a partner hit, kicked or otherwise hurt or frightened you?: no Over the past 2 weeks, have you felt down, depressed or hopeless?: no Over the past 2 weeks, have you felt little interest or pleasure in doing things?:no   Gynecologic History Patient's last menstrual period was 10/17/2014. Contraception: status post hysterectomy Last Pap: 2015. Results were: normal Last mammogram: 09-15-2020. Results were: normal  Obstetric History OB History  Gravida Para Term Preterm AB Living  '3 3 3     3  '$ SAB IAB Ectopic Multiple Live Births          3    # Outcome Date GA Lbr Len/2nd Weight Sex Delivery Anes PTL Lv  3 Term      Vag-Spont   LIV  2 Term      CS-LTranv   LIV  1 Term      Vag-Spont   LIV    Past Medical History:  Diagnosis Date   BV (bacterial vaginosis)    Chlamydia    Family history of anesthesia complication    parents were slow to wake up   Fibroids    Pelvic mass    Vaginal yeast infection     Past Surgical History:  Procedure Laterality Date   CESAREAN SECTION     ROBOTIC ASSISTED TOTAL HYSTERECTOMY N/A 10/29/2014   Procedure: ROBOTIC ASSISTED TOTAL HYSTERECTOMY, LEFT SALPINGOOOPHERECTOMY, RIGHT SALPINGECTOMY;  Surgeon: Lahoma Crocker, MD;   Location: [facility];  Service: Gynecology;  Laterality: N/A;   TUBAL LIGATION       Current Outpatient Medications:    metroNIDAZOLE (FLAGYL) 500 MG tablet, Take 1 tablet (500 mg total) by mouth 2 (two) times daily., Disp: 14 tablet, Rfl: 2   acetaminophen (TYLENOL) 500 MG tablet, Take 1 tablet (500 mg total) by mouth every 6 (six) hours as needed. (Patient not taking: No sig reported), Disp: 30 tablet, Rfl: 0   bismuth subsalicylate (PEPTO BISMOL) 262 MG/15ML suspension, Take 30 mLs by mouth as needed for indigestion. (Patient not taking: No sig reported), Disp: , Rfl:    cyclobenzaprine (FLEXERIL) 10 MG tablet, TAKE 1 TABLET (10 MG TOTAL) BY MOUTH EVERY 8 (EIGHT) HOURS AS NEEDED FOR MUSCLE SPASMS. (Patient not taking: Reported on 09/01/2021), Disp: 30 tablet, Rfl: 2   diclofenac Sodium (VOLTAREN) 1 % GEL, Apply 4 g topically 4 (four) times daily. (Patient not taking: No sig reported), Disp: 100 g, Rfl: 0   fexofenadine (ALLEGRA) 60 MG tablet, Take 60 mg by mouth 2 (two) times daily. (Patient not taking: No sig reported), Disp: , Rfl:    fluticasone (FLONASE) 50 MCG/ACT nasal spray, Place 2 sprays into both nostrils daily. (Patient not taking: No sig reported), Disp: 16 g, Rfl: 0   hydrocortisone cream 1 %, Apply to affected  area 2 times daily (Patient not taking: No sig reported), Disp: 15 g, Rfl: 0   ibuprofen (ADVIL) 600 MG tablet, Take 1 tablet (600 mg total) by mouth every 6 (six) hours as needed. (Patient not taking: Reported on 09/01/2021), Disp: 30 tablet, Rfl: 0   ibuprofen (ADVIL) 800 MG tablet, Take 1 tablet (800 mg total) by mouth 3 (three) times daily. (Patient not taking: No sig reported), Disp: 21 tablet, Rfl: 0   meloxicam (MOBIC) 15 MG tablet, Take 1 tablet (15 mg total) by mouth daily as needed for pain. (Patient not taking: No sig reported), Disp: 10 tablet, Rfl: 0 Allergies  Allergen Reactions   Pineapple Other (See Comments)    Tongue tingles    Social History   Tobacco Use    Smoking status: Former    Packs/day: 0.50    Types: Cigarettes, Cigars    Quit date: 10/15/2015    Years since quitting: 5.8   Smokeless tobacco: Never  Substance Use Topics   Alcohol use: Yes    Comment: rare    Family History  Problem Relation Age of Onset   Hypertension Mother    Heart disease Mother    Arthritis Mother    Alcohol abuse Father    Arthritis Father    Asthma Father    Diabetes Father    Drug abuse Father    Heart disease Father    Hyperlipidemia Father    Hypertension Father    Kidney disease Father    Stroke Father       Review of Systems  Constitutional: negative for fatigue and weight loss Respiratory: negative for cough and wheezing Cardiovascular: negative for chest pain, fatigue and palpitations Gastrointestinal: negative for abdominal pain and change in bowel habits Musculoskeletal:negative for myalgias Neurological: negative for gait problems and tremors Behavioral/Psych: negative for abusive relationship, depression Endocrine: negative for temperature intolerance    Genitourinary: positive for malodorous vaginal discharge.  negative for abnormal menstrual periods, genital lesions, hot flashes, sexual problems  Integument/breast: negative for breast lump, breast tenderness, nipple discharge and skin lesion(s)    Objective:       BP 130/81   Pulse 78   Wt 144 lb (65.3 kg)   LMP 10/17/2014   BMI 26.34 kg/m  General:   Alert and no discharge  Skin:   no rash or abnormalities  Lungs:   clear to auscultation bilaterally  Heart:   regular rate and rhythm, S1, S2 normal, no murmur, click, rub or gallop  Breasts:   normal without suspicious masses, skin or nipple changes or axillary nodes  Abdomen:  normal findings: no organomegaly, soft, non-tender and no hernia  Pelvis:  External genitalia: normal general appearance Urinary system: urethral meatus normal and bladder without fullness, nontender Vaginal: normal without tenderness,  induration or masses Cervix: absent Adnexa: normal bimanual exam Uterus: absent   Lab Review Urine pregnancy test Labs reviewed yes Radiologic studies reviewed yes  I have spent a total of 20 minutes of face-to-face time, excluding clinical staff time, reviewing notes and preparing to see patient, ordering tests and/or medications, and counseling the patient.   Assessment:   1. Encounter for gynecological examination  2. S/P hysterectomy  3. Vaginal discharge, malodorous Rx: - Cervicovaginal ancillary only( [facility]) - metroNIDAZOLE (FLAGYL) 500 MG tablet; Take 1 tablet (500 mg total) by mouth 2 (two) times daily.  Dispense: 14 tablet; Refill: 2  4. Screen for STD (sexually transmitted disease) Rx: - HIV Antibody (routine testing  w rflx) - Hepatitis B surface antigen - RPR - Hepatitis C antibody  5. Screening breast examination Rx: - MM Digital Screening; Future    Plan:    Education reviewed: calcium supplements, depression evaluation, low fat, low cholesterol diet, safe sex/STD prevention, self breast exams, and weight bearing exercise. Mammogram ordered. Follow up in: 1 year.     Orders Placed This Encounter  Procedures   MM Digital Screening    Standing Status:   Future    Standing Expiration Date:   09/01/2022    Order Specific Question:   Reason for Exam (SYMPTOM  OR DIAGNOSIS REQUIRED)    Answer:   Screening    Order Specific Question:   Is the patient pregnant?    Answer:   No    Order Specific Question:   Preferred imaging location?    Answer:   [facility]   HIV Antibody (routine testing w rflx)   Hepatitis B surface antigen   RPR   Hepatitis C antibody     Shelly Bombard, MD 09/01/2021 9:57 AM

## 2021-09-02 LAB — RPR: RPR Ser Ql: NONREACTIVE

## 2021-09-02 LAB — HEPATITIS B SURFACE ANTIGEN: Hepatitis B Surface Ag: NEGATIVE

## 2021-09-02 LAB — HEPATITIS C ANTIBODY: Hep C Virus Ab: <0.1 s/co ratio (ref 0.0–0.9)

## 2021-09-02 LAB — HIV ANTIBODY (ROUTINE TESTING W REFLEX): HIV Screen 4th Generation wRfx: NONREACTIVE

## 2021-09-04 ENCOUNTER — Other Ambulatory Visit: Payer: Self-pay

## 2021-09-04 ENCOUNTER — Ambulatory Visit (HOSPITAL_BASED_OUTPATIENT_CLINIC_OR_DEPARTMENT_OTHER): Admission: RE | Admit: 2021-09-04 | Payer: Medicaid Other | Source: Ambulatory Visit | Admitting: Radiology

## 2021-09-04 ENCOUNTER — Ambulatory Visit (INDEPENDENT_AMBULATORY_CARE_PROVIDER_SITE_OTHER): Payer: Medicaid Other | Admitting: Physician Assistant

## 2021-09-04 DIAGNOSIS — M25562 Pain in left knee: Secondary | ICD-10-CM | POA: Diagnosis not present

## 2021-09-04 DIAGNOSIS — G8929 Other chronic pain: Secondary | ICD-10-CM

## 2021-09-04 DIAGNOSIS — M25561 Pain in right knee: Secondary | ICD-10-CM

## 2021-09-04 LAB — CERVICOVAGINAL ANCILLARY ONLY
Bacterial Vaginitis (gardnerella): NEGATIVE
Candida Glabrata: NEGATIVE
Candida Vaginitis: NEGATIVE
Chlamydia: NEGATIVE
Comment: NEGATIVE
Comment: NEGATIVE
Comment: NEGATIVE
Comment: NEGATIVE
Comment: NEGATIVE
Comment: NORMAL
Neisseria Gonorrhea: NEGATIVE
Trichomonas: NEGATIVE

## 2021-09-04 MED ORDER — METHYLPREDNISOLONE ACETATE 40 MG/ML IJ SUSP
40.0000 mg | INTRAMUSCULAR | Status: AC | PRN
  Administered 2021-09-04: 40 mg via INTRA_ARTICULAR

## 2021-09-04 MED ORDER — LIDOCAINE HCL 1 % IJ SOLN
5.0000 mL | INTRAMUSCULAR | Status: AC | PRN
  Administered 2021-09-04: 5 mL

## 2021-09-04 NOTE — Progress Notes (Signed)
Office Visit Note   Patient: Anna Schroeder           Date of Birth: 02-23-1981           MRN: [id number] Visit Date: 09/04/2021              Requested by: Kerin Perna, NP [address],  [place] [address] PCP: Patient, No Pcp Per (Inactive)  Chief Complaint  Patient presents with  . Left Knee - Pain      HPI: Patient is a pleasant active 40 year old woman with a long history of chronic left knee pain.  She does say that she had a significant injury to her knee while playing soccer when she was 13.  She did not seek any medical attention.  She describes a twisting injury.  More recently she fell down on some gravel when her knee gave way.  While she does not have much swelling in the knee.  She has locking catching where she often has to lay on her bed and manipulate her knee.  She has tried knee braces that helps.  She would like to be more active  Assessment & Plan: Visit Diagnoses: No diagnosis found.  Plan:  History of left knee pain.  She does have mechanical symptoms such as locking and catching.  We will try an injection today.  Follow-up in 3 weeks.  If she is still having difficulties I would recommend an MRI scan to evaluate for meniscal injury Follow-Up Instructions: No follow-ups on file.   Ortho Exam  Patient is alert, oriented, no adenopathy, well-dressed, normal affect, normal respiratory effort. Examination of her left knee no effusion no swelling no cellulitis.  She is focally tender over the medial joint line.  Good stability.  No tenderness over the lateral joint line or patellofemoral joint.  Good varus valgus stability  Imaging: No results found. No images are attached to the encounter.  Labs: Lab Results  Component Value Date   HGBA1C 4.8 08/17/2021   REPTSTATUS 02/03/2017 FINAL 02/01/2017   CULT MULTIPLE SPECIES PRESENT, SUGGEST RECOLLECTION (A) 02/01/2017     Lab Results  Component Value Date   ALBUMIN 4.0 02/15/2018    ALBUMIN 4.1 09/11/2017   ALBUMIN 4.1 08/22/2014    No results found for: MG No results found for: VD25OH  No results found for: PREALBUMIN CBC EXTENDED Latest Ref Rng & Units 02/15/2018 11/27/2017 09/11/2017  WBC 4.0 - 10.5 K/uL 6.6 8.5 11.2(H)  RBC 3.87 - 5.11 MIL/uL 4.57 4.41 4.44  HGB 12.0 - 15.0 g/dL 13.7 13.5 13.2  HCT 36.0 - 46.0 % 38.8 37.1 36.9  PLT 150 - 400 K/uL 185 185 194  NEUTROABS 1.7 - 7.7 K/uL - - 5.8  LYMPHSABS 0.7 - 4.0 K/uL - - 4.7(H)     There is no height or weight on file to calculate BMI.  Orders:  No orders of the defined types were placed in this encounter.  No orders of the defined types were placed in this encounter.    Procedures: Large Joint Inj: L knee on 09/04/2021 11:45 AM Indications: pain and diagnostic evaluation Details: 22 G 1.5 in needle, anteromedial approach  Arthrogram: No  Medications: 40 mg methylPREDNISolone acetate 40 MG/ML; 5 mL lidocaine 1 % Outcome: tolerated well, no immediate complications Procedure, treatment alternatives, risks and benefits explained, specific risks discussed. Consent was given by the patient.     Clinical Data: No additional findings.  ROS:  All other systems negative,  except as noted in the HPI. Review of Systems  Objective: Vital Signs: LMP 10/17/2014   Specialty Comments:  No specialty comments available.  PMFS History: Patient Active Problem List   Diagnosis Date Noted  . Endometriosis determined by laparoscopy 10/30/2014  . Pelvic pain in female 09/27/2014  . OBESITY, NOS 02/20/2007   Past Medical History:  Diagnosis Date  . BV (bacterial vaginosis)   . Chlamydia   . Family history of anesthesia complication    parents were slow to wake up  . Fibroids   . Pelvic mass   . Vaginal yeast infection     Family History  Problem Relation Age of Onset  . Hypertension Mother   . Heart disease Mother   . Arthritis Mother   . Alcohol abuse Father   . Arthritis Father   . Asthma  Father   . Diabetes Father   . Drug abuse Father   . Heart disease Father   . Hyperlipidemia Father   . Hypertension Father   . Kidney disease Father   . Stroke Father     Past Surgical History:  Procedure Laterality Date  . CESAREAN SECTION    . ROBOTIC ASSISTED TOTAL HYSTERECTOMY N/A 10/29/2014   Procedure: ROBOTIC ASSISTED TOTAL HYSTERECTOMY, LEFT SALPINGOOOPHERECTOMY, RIGHT SALPINGECTOMY;  Surgeon: Lahoma Crocker, MD;  Location: [facility];  Service: Gynecology;  Laterality: N/A;  . TUBAL LIGATION     Social History   Occupational History  . Not on file  Tobacco Use  . Smoking status: Former    Packs/day: 0.50    Types: Cigarettes, Cigars    Quit date: 10/15/2015    Years since quitting: 5.8  . Smokeless tobacco: Never  Vaping Use  . Vaping Use: Never used  Substance and Sexual Activity  . Alcohol use: Yes    Comment: rare  . Drug use: Yes    Types: Marijuana    Comment: once per month  . Sexual activity: Yes    Partners: Male    Birth control/protection: Surgical

## 2021-09-18 ENCOUNTER — Ambulatory Visit (HOSPITAL_BASED_OUTPATIENT_CLINIC_OR_DEPARTMENT_OTHER)
Admission: RE | Admit: 2021-09-18 | Discharge: 2021-09-18 | Disposition: A | Discharge status: Home / Self Care | Payer: Medicaid Other | Source: Ambulatory Visit | Attending: Obstetrics | Admitting: Obstetrics

## 2021-09-18 ENCOUNTER — Other Ambulatory Visit: Payer: Self-pay

## 2021-09-18 DIAGNOSIS — Z1231 Encounter for screening mammogram for malignant neoplasm of breast: Secondary | ICD-10-CM | POA: Diagnosis not present

## 2021-09-18 DIAGNOSIS — Z1239 Encounter for other screening for malignant neoplasm of breast: Secondary | ICD-10-CM

## 2021-09-21 ENCOUNTER — Ambulatory Visit: Payer: Medicaid Other | Admitting: Orthopedic Surgery

## 2021-09-22 ENCOUNTER — Other Ambulatory Visit: Payer: Self-pay | Admitting: Obstetrics

## 2021-09-22 DIAGNOSIS — R928 Other abnormal and inconclusive findings on diagnostic imaging of breast: Secondary | ICD-10-CM

## 2021-09-25 ENCOUNTER — Ambulatory Visit: Payer: Medicaid Other | Admitting: Orthopedic Surgery

## 2021-09-26 ENCOUNTER — Ambulatory Visit (INDEPENDENT_AMBULATORY_CARE_PROVIDER_SITE_OTHER): Payer: Medicaid Other | Admitting: Family

## 2021-09-26 ENCOUNTER — Encounter: Payer: Self-pay | Admitting: Family

## 2021-09-26 DIAGNOSIS — G8929 Other chronic pain: Secondary | ICD-10-CM

## 2021-09-26 DIAGNOSIS — M25561 Pain in right knee: Secondary | ICD-10-CM | POA: Diagnosis not present

## 2021-09-26 DIAGNOSIS — M25562 Pain in left knee: Secondary | ICD-10-CM | POA: Diagnosis not present

## 2021-09-26 NOTE — Progress Notes (Signed)
Office Visit Note   Patient: Anna Schroeder           Date of Birth: July 11, 1981           MRN: [id number] Visit Date: 09/26/2021              Requested by: No referring provider defined for this encounter. PCP: Patient, No Pcp Per (Inactive)  No chief complaint on file.     HPI: The patient is a 40 year old woman who presents today in follow-up for chronic left knee pain.  She did have a significant injury or twisting event when she was in her teens.  She is recently had some giving way of the knee which has resulted in falls.  Denies any associated swelling.  She does have mechanical symptoms difficulty getting comfortable in bed she has tried knee braces without any relief.  Assessment & Plan: Visit Diagnoses: No diagnosis found.  Plan: As she has not had any improvement with conservative measures we will proceed with MRI of the left knee evaluating for meniscal injury.  She will follow-up for review of her MRI scan  Follow-Up Instructions: No follow-ups on file.   Ortho Exam  Patient is alert, oriented, no adenopathy, well-dressed, normal affect, normal respiratory effort. On examination of the left knee she has no effusion no swelling no cellulitis she does have tenderness over the medial joint line and the knee is stable there is no tenderness over her lateral or patellofemoral joint.  Stable to varus and valgus stress  Imaging: No results found. No images are attached to the encounter.  Labs: Lab Results  Component Value Date   HGBA1C 4.8 08/17/2021   REPTSTATUS 02/03/2017 FINAL 02/01/2017   CULT MULTIPLE SPECIES PRESENT, SUGGEST RECOLLECTION (A) 02/01/2017     Lab Results  Component Value Date   ALBUMIN 4.0 02/15/2018   ALBUMIN 4.1 09/11/2017   ALBUMIN 4.1 08/22/2014    No results found for: MG No results found for: VD25OH  No results found for: PREALBUMIN CBC EXTENDED Latest Ref Rng & Units 02/15/2018 11/27/2017 09/11/2017  WBC 4.0 - 10.5 K/uL 6.6 8.5  11.2(H)  RBC 3.87 - 5.11 MIL/uL 4.57 4.41 4.44  HGB 12.0 - 15.0 g/dL 13.7 13.5 13.2  HCT 36.0 - 46.0 % 38.8 37.1 36.9  PLT 150 - 400 K/uL 185 185 194  NEUTROABS 1.7 - 7.7 K/uL - - 5.8  LYMPHSABS 0.7 - 4.0 K/uL - - 4.7(H)     There is no height or weight on file to calculate BMI.  Orders:  No orders of the defined types were placed in this encounter.  No orders of the defined types were placed in this encounter.    Procedures: No procedures performed  Clinical Data: No additional findings.  ROS:  All other systems negative, except as noted in the HPI. Review of Systems  Constitutional:  Negative for chills and fever.  Cardiovascular:  Negative for leg swelling.  Musculoskeletal:  Positive for arthralgias and gait problem. Negative for joint swelling.   Objective: Vital Signs: LMP 10/17/2014   Specialty Comments:  No specialty comments available.  PMFS History: Patient Active Problem List   Diagnosis Date Noted   Endometriosis determined by laparoscopy 10/30/2014   Pelvic pain in female 09/27/2014   OBESITY, NOS 02/20/2007   Past Medical History:  Diagnosis Date   BV (bacterial vaginosis)    Chlamydia    Family history of anesthesia complication    parents were slow to wake  up   Fibroids    Pelvic mass    Vaginal yeast infection     Family History  Problem Relation Age of Onset   Hypertension Mother    Heart disease Mother    Arthritis Mother    Alcohol abuse Father    Arthritis Father    Asthma Father    Diabetes Father    Drug abuse Father    Heart disease Father    Hyperlipidemia Father    Hypertension Father    Kidney disease Father    Stroke Father     Past Surgical History:  Procedure Laterality Date   CESAREAN SECTION     ROBOTIC ASSISTED TOTAL HYSTERECTOMY N/A 10/29/2014   Procedure: ROBOTIC ASSISTED TOTAL HYSTERECTOMY, LEFT SALPINGOOOPHERECTOMY, RIGHT SALPINGECTOMY;  Surgeon: Lahoma Crocker, MD;  Location: [facility];  Service:  Gynecology;  Laterality: N/A;   TUBAL LIGATION     Social History   Occupational History   Not on file  Tobacco Use   Smoking status: Former    Packs/day: 0.50    Types: Cigarettes, Cigars    Quit date: 10/15/2015    Years since quitting: 5.9   Smokeless tobacco: Never  Vaping Use   Vaping Use: Never used  Substance and Sexual Activity   Alcohol use: Yes    Comment: rare   Drug use: Yes    Types: Marijuana    Comment: once per month   Sexual activity: Yes    Partners: Male    Birth control/protection: Surgical

## 2021-10-12 ENCOUNTER — Other Ambulatory Visit: Payer: Self-pay

## 2021-10-12 ENCOUNTER — Ambulatory Visit: Payer: Medicaid Other

## 2021-10-12 ENCOUNTER — Ambulatory Visit
Admission: RE | Admit: 2021-10-12 | Discharge: 2021-10-12 | Disposition: A | Discharge status: Home / Self Care | Payer: Medicaid Other | Source: Ambulatory Visit | Attending: Obstetrics | Admitting: Obstetrics

## 2021-10-12 DIAGNOSIS — R922 Inconclusive mammogram: Secondary | ICD-10-CM | POA: Diagnosis not present

## 2021-10-12 DIAGNOSIS — R928 Other abnormal and inconclusive findings on diagnostic imaging of breast: Secondary | ICD-10-CM

## 2021-10-17 ENCOUNTER — Ambulatory Visit (INDEPENDENT_AMBULATORY_CARE_PROVIDER_SITE_OTHER): Payer: Medicaid Other | Admitting: Primary Care

## 2021-11-10 ENCOUNTER — Encounter: Payer: Self-pay | Admitting: *Deleted

## 2021-12-07 ENCOUNTER — Other Ambulatory Visit: Payer: Self-pay

## 2021-12-07 ENCOUNTER — Ambulatory Visit
Admission: RE | Admit: 2021-12-07 | Discharge: 2021-12-07 | Disposition: A | Discharge status: Home / Self Care | Payer: Medicaid Other | Source: Ambulatory Visit | Attending: Family | Admitting: Family

## 2021-12-07 DIAGNOSIS — G8929 Other chronic pain: Secondary | ICD-10-CM

## 2021-12-07 DIAGNOSIS — M25562 Pain in left knee: Secondary | ICD-10-CM | POA: Diagnosis not present

## 2021-12-19 DIAGNOSIS — U071 COVID-19: Secondary | ICD-10-CM | POA: Diagnosis not present

## 2022-01-09 ENCOUNTER — Encounter (INDEPENDENT_AMBULATORY_CARE_PROVIDER_SITE_OTHER): Payer: Self-pay | Admitting: Primary Care

## 2022-01-31 NOTE — Telephone Encounter (Signed)
It would be best if she came in for a visit to discuss mri with duda and possible interventions, if not possible I can call her again on Friday  If you dont mind offering an appointment to her

## 2022-02-01 ENCOUNTER — Telehealth: Payer: Self-pay | Admitting: Orthopedic Surgery

## 2022-02-01 NOTE — Telephone Encounter (Signed)
Called patient per Erin's request to schedule an appointment with Dr. Sharol Given to discuss MRI and possible intervention.

## 2022-02-08 DIAGNOSIS — K047 Periapical abscess without sinus: Secondary | ICD-10-CM | POA: Diagnosis not present

## 2022-02-08 DIAGNOSIS — R030 Elevated blood-pressure reading, without diagnosis of hypertension: Secondary | ICD-10-CM | POA: Diagnosis not present

## 2022-02-08 DIAGNOSIS — F32A Depression, unspecified: Secondary | ICD-10-CM | POA: Diagnosis not present

## 2022-02-08 DIAGNOSIS — G43909 Migraine, unspecified, not intractable, without status migrainosus: Secondary | ICD-10-CM | POA: Diagnosis not present

## 2022-02-16 ENCOUNTER — Other Ambulatory Visit: Payer: Self-pay | Admitting: Obstetrics

## 2022-02-16 DIAGNOSIS — G8929 Other chronic pain: Secondary | ICD-10-CM

## 2022-02-16 DIAGNOSIS — N760 Acute vaginitis: Secondary | ICD-10-CM | POA: Diagnosis not present

## 2022-02-22 ENCOUNTER — Telehealth (INDEPENDENT_AMBULATORY_CARE_PROVIDER_SITE_OTHER): Payer: Self-pay | Admitting: Clinical

## 2022-02-22 ENCOUNTER — Ambulatory Visit (INDEPENDENT_AMBULATORY_CARE_PROVIDER_SITE_OTHER): Payer: Medicaid Other | Admitting: Clinical

## 2022-02-22 ENCOUNTER — Encounter (INDEPENDENT_AMBULATORY_CARE_PROVIDER_SITE_OTHER): Payer: Self-pay

## 2022-02-22 ENCOUNTER — Other Ambulatory Visit: Payer: Self-pay

## 2022-02-22 DIAGNOSIS — F331 Major depressive disorder, recurrent, moderate: Secondary | ICD-10-CM | POA: Diagnosis not present

## 2022-02-22 DIAGNOSIS — F411 Generalized anxiety disorder: Secondary | ICD-10-CM

## 2022-02-22 NOTE — BH Specialist Note (Signed)
[facility]ated [facility]al [facility] Initial In-Person Visit ? ?MRN: [id number] ?Name: Anna Schroeder ? ?Number of [facility] Clinician visits: 1- Initial Visit ? ?Session Start time: 0905 ?   ?Session End time: 4098 ? ?Total time in minutes: 60 ? ? ?Types of Service: Individual psychotherapy ? ?Interpretor:No. Interpretor Name and Language: N/A ? ? Warm Hand Off Completed. ?  ? ?  ? ? ?Subjective: ?Anna Schroeder is a 41 y.o. female accompanied by  self ?Patient was referred by Juluis Mire, NP for depression and anxiety. ?Patient reports the following symptoms/concerns: Reports  feeling depressed, decreased motivation, self-esteem disturbance, fidgeting, anxiousness, excessive worrying, excessive worrying, trouble relaxing, restlessness, and racing thoughts. Reports that she continues to feel bad about a traumatic experience in witnessing her friend being killed during childhood. Reports experiencing multiple family deaths. Reports noticing her symptoms worsening after her brother died in 03/15/20. Reports that she has trouble staying asleep. Reports that she constantly worries at night and experiences racing thoughts.  ?Duration of problem: 1+ year; Severity of problem: moderate ? ?Objective: ?Mood: Anxious and Depressed and Affect: Appropriate and Tearful ?Risk of harm to self or others: No plan to harm self or others Endorses thoughts of being better off dead however denies SI/HI.  ? ?Life Context: ?Family and Social: Pt has three children (71, 64, & 73) and a girlfriend.  ?School/Work: Pt is employed. ?Self-Care: Pt endorses daily marijuana use.  ?Life Changes: Pt has experienced family deaths and a hx of trauma. ? ?Patient and/or Family's Strengths/Protective Factors: ?Concrete supports in place (healthy food, safe environments, etc.) and Sense of purpose ? ?Goals Addressed: ?Patient will: ?Reduce symptoms of: anxiety and depression ?Increase knowledge and/or ability of: coping skills and healthy  habits  ?Demonstrate ability to: Increase healthy adjustment to current life circumstances ? ?Progress towards Goals: ?Ongoing ? ?Interventions: ?Interventions utilized: Optician, dispensing, CBT Cognitive Behavioral Therapy, and Supportive Counseling  ?Standardized Assessments completed: GAD-7 and PHQ 9 ? ?Patient and/or Family Response: Pt receptive to tx. Pt receptive to psychoeducation provided on depression and anxiety. Pt receptive to cognitive restructuring to decrease worries. Pt receptive to identifying a night routine to assist with sleep. Pt receptive to journaling and considering exercise. ? ?Patient Centered Plan: ?Patient is on the following Treatment Plan(s):  Depression and anxiety ? ?Assessment: ?Endorses thoughts of being better off dead however denies SI/HI. Endorses protective factors as children and her mother. Safety plan and crisis resources provided. Pt acknowledges proper precaution to take by utilizing resources if SI arises with plan, means, and intent. Denies auditory/visual hallucinations. Patient currently experiencing depression and anxiety. Pt has significant hx of trauma. Pt appears to have feelings of guilt related to her best friend being killed in childhood. Pt experiences racing thoughts which contributes to pt's difficulty staying asleep.  ?  ?Patient may benefit from outpatient therapy. Pt will consider outpatient therapy and fu with LCSW. LCSW provided psychoeducation on anxiety and depression. LCSW utilized cognitive restructuring to decrease worries. LCSW encouraged pt to identify night routine that will aid in relaxation. LCSW encouraged pt to utilize journaling and consider exercising. ? ?Plan: ?Follow up with behavioral health clinician on : 03/15/22 ?Behavioral recommendations: Visual merchandiser and exercising. Establish a night routine. Utilize provided crisis resources if SI arises with plan, means, and intent.  ?Referral(s): [facility]) ?"From scale of 1-10, how likely are you to follow plan?": 10 ? ?Vasti Yagi C Camran Keady, LCSW ? ? ? ? ? ? ? ? ?

## 2022-02-26 NOTE — Telephone Encounter (Signed)
Error

## 2022-03-06 ENCOUNTER — Telehealth: Payer: Self-pay | Admitting: Family

## 2022-03-06 NOTE — Telephone Encounter (Signed)
Sent pt a message in mychart to advise that Junie Panning had sent a message to the front desk 02/01/2022 to advise she needs an appt with Dr. Sharol Given to review her MRI from December. Sent a message to the pt to ask if she would like to make that appt. Can call the office to schedule.  ?

## 2022-03-06 NOTE — Telephone Encounter (Signed)
Pt called stating she returning call to [name]. Pt asked if she can message her on mychart on call the number on file for her. Pt phone number is [phone] ?

## 2022-03-08 ENCOUNTER — Institutional Professional Consult (permissible substitution) (INDEPENDENT_AMBULATORY_CARE_PROVIDER_SITE_OTHER): Payer: Medicaid Other | Admitting: Clinical

## 2022-03-12 ENCOUNTER — Ambulatory Visit (INDEPENDENT_AMBULATORY_CARE_PROVIDER_SITE_OTHER): Payer: Medicaid Other | Admitting: Orthopedic Surgery

## 2022-03-12 ENCOUNTER — Other Ambulatory Visit: Payer: Self-pay

## 2022-03-12 DIAGNOSIS — G8929 Other chronic pain: Secondary | ICD-10-CM | POA: Diagnosis not present

## 2022-03-12 DIAGNOSIS — M25562 Pain in left knee: Secondary | ICD-10-CM | POA: Diagnosis not present

## 2022-03-13 ENCOUNTER — Encounter: Payer: Self-pay | Admitting: Orthopedic Surgery

## 2022-03-13 NOTE — Progress Notes (Signed)
? ?Office Visit Note ?  ?Patient: Anna Schroeder           ?Date of Birth: 23-Mar-1981           ?MRN: [id number] ?Visit Date: 03/12/2022 ?             ?Requested by: No referring provider defined for this encounter. ?PCP: Patient, No Pcp Per (Inactive) ? ?Chief Complaint  ?Patient presents with  ? Left Knee - Follow-up  ?  MRI 11/2021  ? ? ? ? ?HPI: ?Patient is a 41 year old woman who presents in follow-up status post MRI scan left knee.  Patient states that her knee is not as painful now after she started exercising. ? ?Assessment & Plan: ?Visit Diagnoses:  ?1. Chronic pain of left knee   ? ? ?Plan: Recommended continue strengthening and conditioning.  Do not feel that surgical intervention would be helpful at this time. ? ?Follow-Up Instructions: Return if symptoms worsen or fail to improve.  ? ?Ortho Exam ? ?Patient is alert, oriented, no adenopathy, well-dressed, normal affect, normal respiratory effort. ?Examination patient has stable collaterals and cruciates she has no tenderness to palpation medially or laterally there is no effusion patellofemoral joint has no crepitation with range of motion.  Review of her MRI scan shows a horizontal edema with in the posterior horn and body of the medial meniscus there is no displacement of the meniscus.  There is also some edema within the posterior horn of the lateral meniscus without evidence of meniscal tear either.  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  No loose bodies.  No effusion. ? ?Imaging: ?No results found. ?No images are attached to the encounter. ? ?Labs: ?Lab Results  ?Component Value Date  ? HGBA1C 4.8 08/17/2021  ? REPTSTATUS 02/03/2017 FINAL 02/01/2017  ? CULT MULTIPLE SPECIES PRESENT, SUGGEST RECOLLECTION (A) 02/01/2017  ? ? ? ?Lab Results  ?Component Value Date  ? ALBUMIN 4.0 02/15/2018  ? ALBUMIN 4.1 09/11/2017  ? ALBUMIN 4.1 08/22/2014  ? ? ?No results found for: MG ?No results  found for: VD25OH ? ?No results found for: PREALBUMIN ?CBC EXTENDED Latest Ref Rng & Units 02/15/2018 11/27/2017 09/11/2017  ?WBC 4.0 - 10.5 K/uL 6.6 8.5 11.2(H)  ?RBC 3.87 - 5.11 MIL/uL 4.57 4.41 4.44  ?HGB 12.0 - 15.0 g/dL 13.7 13.5 13.2  ?HCT 36.0 - 46.0 % 38.8 37.1 36.9  ?PLT 150 - 400 K/uL 185 185 194  ?NEUTROABS 1.7 - 7.7 K/uL - - 5.8  ?LYMPHSABS 0.7 - 4.0 K/uL - - 4.7(H)  ? ? ? ?There is no height or weight on file to calculate BMI. ? ?Orders:  ?No orders of the defined types were placed in this encounter. ? ?No orders of the defined types were placed in this encounter. ? ? ? Procedures: ?No procedures performed ? ?Clinical Data: ?No additional findings. ? ?ROS: ? ?All other systems negative, except as noted in the HPI. ?Review of Systems ? ?Objective: ?Vital Signs: LMP 10/17/2014  ? ?Specialty Comments:  ?No specialty comments available. ? ?PMFS History: ?Patient Active Problem List  ? Diagnosis Date Noted  ? Endometriosis determined by laparoscopy 10/30/2014  ? Pelvic pain in female 09/27/2014  ? OBESITY, NOS 02/20/2007  ? ?Past Medical History:  ?Diagnosis Date  ? BV (bacterial vaginosis)   ? Chlamydia   ? Family history of anesthesia complication   ? parents were slow to wake up  ? Fibroids   ?  Pelvic mass   ? Vaginal yeast infection   ?  ?Family History  ?Problem Relation Age of Onset  ? Hypertension Mother   ? Heart disease Mother   ? Arthritis Mother   ? Alcohol abuse Father   ? Arthritis Father   ? Asthma Father   ? Diabetes Father   ? Drug abuse Father   ? Heart disease Father   ? Hyperlipidemia Father   ? Hypertension Father   ? Kidney disease Father   ? Stroke Father   ?  ?Past Surgical History:  ?Procedure Laterality Date  ? CESAREAN SECTION    ? ROBOTIC ASSISTED TOTAL HYSTERECTOMY N/A 10/29/2014  ? Procedure: ROBOTIC ASSISTED TOTAL HYSTERECTOMY, LEFT SALPINGOOOPHERECTOMY, RIGHT SALPINGECTOMY;  Surgeon: Lahoma Crocker, MD;  Location: [facility];  Service: Gynecology;  Laterality: N/A;  ? TUBAL  LIGATION    ? ?Social History  ? ?Occupational History  ? Not on file  ?Tobacco Use  ? Smoking status: Former  ?  Packs/day: 0.50  ?  Types: Cigarettes, Cigars  ?  Quit date: 10/15/2015  ?  Years since quitting: 6.4  ? Smokeless tobacco: Never  ?Vaping Use  ? Vaping Use: Never used  ?Substance and Sexual Activity  ? Alcohol use: Yes  ?  Comment: rare  ? Drug use: Yes  ?  Types: Marijuana  ?  Comment: once per month  ? Sexual activity: Yes  ?  Partners: Male  ?  Birth control/protection: Surgical  ? ? ? ? ? ?

## 2022-03-15 ENCOUNTER — Ambulatory Visit (INDEPENDENT_AMBULATORY_CARE_PROVIDER_SITE_OTHER): Payer: Medicaid Other | Admitting: Clinical

## 2022-03-15 ENCOUNTER — Other Ambulatory Visit: Payer: Self-pay

## 2022-03-15 ENCOUNTER — Encounter (INDEPENDENT_AMBULATORY_CARE_PROVIDER_SITE_OTHER): Payer: Self-pay

## 2022-03-19 DIAGNOSIS — H10012 Acute follicular conjunctivitis, left eye: Secondary | ICD-10-CM | POA: Diagnosis not present

## 2022-03-19 DIAGNOSIS — R030 Elevated blood-pressure reading, without diagnosis of hypertension: Secondary | ICD-10-CM | POA: Diagnosis not present

## 2022-03-29 ENCOUNTER — Ambulatory Visit (INDEPENDENT_AMBULATORY_CARE_PROVIDER_SITE_OTHER): Payer: Medicaid Other | Admitting: Clinical

## 2022-04-09 ENCOUNTER — Ambulatory Visit: Payer: Medicaid Other | Admitting: Orthopedic Surgery

## 2022-05-16 DIAGNOSIS — M67824 Other specified disorders of tendon, left elbow: Secondary | ICD-10-CM | POA: Diagnosis not present

## 2022-07-24 ENCOUNTER — Telehealth: Payer: Self-pay

## 2022-07-24 NOTE — Telephone Encounter (Signed)
..  Ms.  [facility] Managed Care   Unsuccessful Outreach Note  07/24/2022 Name: Anna Schroeder MRN: [id number] DOB: 19-Jan-1981  Referred by: Pcp, No Reason for referral : High Risk Managed Medicaid (I called the patient today to get her scheduled with the MM Team. I left my name and number on her VM.)   An unsuccessful telephone outreach was attempted today. The patient was referred to the case management team for assistance with care management and care coordination.   Follow Up Plan: The care management team will reach out to the patient again over the next 14 days.   [place]

## 2022-08-22 ENCOUNTER — Other Ambulatory Visit: Payer: Self-pay | Admitting: Obstetrics and Gynecology

## 2022-08-22 NOTE — Patient Outreach (Signed)
Care Coordination  08/22/2022  Anna Schroeder [birth year] [id number]   Medicaid Managed Care   Unsuccessful Outreach Note  08/22/2022 Name: Anna Schroeder MRN: [id number] DOB: [birth year]  Referred by: Pcp, No Reason for referral : High Risk Managed Medicaid (Unsuccessful telephone outreach)   An unsuccessful telephone outreach was attempted today. The patient was referred to the case management team for assistance with care management and care coordination.   Follow Up Plan: The care management team will reach out to the patient again over the next 30 business  days.   Aida Raider RN, BSN [place]  [facility] - Managed Medicaid High Risk [phone].

## 2022-08-22 NOTE — Patient Instructions (Signed)
Hi Ms. Roes, I am sorry I missed you today - as a part of your Medicaid benefit, you are eligible for care management and care coordination services at no cost or copay. I was unable to reach you by phone today but would be happy to help you with your health related needs. Please feel free to call me at [phone].  A member of the Managed Medicaid care management team will reach out to you again over the next 30 business days.   Aida Raider RN, BSN [place]  [facility] - Managed Medicaid High Risk [phone]

## 2022-09-06 DIAGNOSIS — L282 Other prurigo: Secondary | ICD-10-CM | POA: Diagnosis not present

## 2022-09-06 DIAGNOSIS — T22211A Burn of second degree of right forearm, initial encounter: Secondary | ICD-10-CM | POA: Diagnosis not present

## 2022-09-25 ENCOUNTER — Other Ambulatory Visit: Payer: Self-pay | Admitting: Obstetrics and Gynecology

## 2022-09-25 NOTE — Patient Instructions (Signed)
Hi Anna Schroeder, it was a pleasure speaking with you today-have a great day!!  Anna Schroeder was given information about Medicaid Managed Care team care coordination services as a part of their [place] Medicaid benefit. Anna Schroeder verbally consented to engagement with the [facility] Managed Care team.   If you are experiencing a medical emergency, please call 911 or report to your local emergency department or urgent care.   If you have a non-emergency medical problem during routine business hours, please contact your provider's office and ask to speak with a nurse.   For questions related to your [facility], please call: [phone] or visit the homepage here: [email or web address]  If you would like to schedule transportation through your [facility], please call the following number at least 2 days in advance of your appointment: [phone]   Rides for urgent appointments can also be made after hours by calling Member Services.  Call the [place] at [phone], at any time, 24 hours a day, 7 days a week. If you are in danger or need immediate medical attention call 911.  If you would like help to quit smoking, call 1-800-QUIT-NOW (336)732-3759) OR Espaol: 1-855-Djelo-Ya (4-580-998-3382) o para ms informacin haga clic aqu or Text READY to 200-400 to register via text  Anna Schroeder - following are the goals we discussed in your visit today:   Goals Addressed    Timeframe:  Long-Range Goal Priority:  High Start Date:  09/25/22                           Expected End Date:ongoing                       Follow Up Date 10/29/22    - schedule appointment for flu shot - schedule appointment for vaccines needed due to my age or health - schedule recommended health tests (blood work, mammogram, colonoscopy, pap test) - schedule and keep appointment  for annual check-up    Why is this important?   Screening tests can find diseases early when they are easier to treat.  Your doctor or nurse will talk with you about which tests are important for you.  Getting shots for common diseases like the flu and shingles will help prevent them.    Patient verbalizes understanding of instructions and care plan provided today and agrees to view in [place]. Active MyChart status and patient understanding of how to access instructions and care plan via MyChart confirmed with patient.     The Managed Medicaid care management team will reach out to the patient again over the next 30 business  days.  The  Patient has been provided with contact information for the Managed Medicaid care management team and has been advised to call with any health related questions or concerns.   Aida Raider RN, BSN [place] Management Coordinator - Managed Medicaid High Risk [phone]   Following is a copy of your plan of care:  Care Plan : [place] of Care  Updates made by Gayla Medicus, RN since 09/25/2022 12:00 AM     Problem: Health Promotion or Disease Self-Management (General Plan of Care)      Goal: Chronic Disease Management Care Coordination needs   Start Date: 09/25/2022  Expected End Date: 12/26/2022  Priority: High  Note:  Current Barriers:  Knowledge Deficits related to plan of care for management of left knee pain, h/o anxiety and depression  Care Coordination needs related to h/o anxiety and depression Chronic Disease Management support and education needs related to left knee pain and h/o anxiety/depression   RNCM Clinical Goal(s):  Patient will verbalize understanding of plan for management of left knee pain, h/o anxiety and depression as evidenced by patient report verbalize basic understanding of  left knee pain, h/o anxiety/depression disease process and self health management plan as evidenced by  patient report take all medications exactly as prescribed and will call provider for medication related questions as evidenced by patient report demonstrate understanding of rationale for each prescribed medication as evidenced by patient report attend all scheduled medical appointments as evidenced by patient report continue to work with RN Care Manager to address care management and care coordination needs related to  left knee pain, h/o anxiety and depression. as evidenced by adherence to CM Team Scheduled appointments work with Education officer, museum to address  related to the management of h/o anxiety and depression  as evidenced by review of EMR and patient or Education officer, museum report through collaboration with Consulting civil engineer, provider, and care team.   Interventions: Inter-disciplinary care team collaboration (see longitudinal plan of care) Evaluation of current treatment plan related to  self management and patient's adherence to plan as established by provider    (Status:  New goal.)  Long Term Goal Evaluation of current treatment plan related to  left knee pain and h/o anxiety/depression  , self-management and patient's adherence to plan as established by provider. Discussed plans with patient for ongoing care management follow up and provided patient with direct contact information for care management team Evaluation of current treatment plan related to left knee pain, anxiety/depression  and patient's adherence to plan as established by provider Reviewed medications with patient Collaborated with LCSW regarding anxiety and depression. Reviewed scheduled/upcoming provider appointments Social Work referral for anxiety/depression. Discussed plans with patient for ongoing care management follow up and provided patient with direct contact information for care management team Assessed social determinant of health barriers  Patient Goals/Self-Care Activities: Take all medications as  prescribed Attend all scheduled provider appointments Call pharmacy for medication refills 3-7 days in advance of running out of medications Perform all self care activities independently  Perform IADL's (shopping, preparing meals, housekeeping, managing finances) independently Call provider office for new concerns or questions  Work with the social worker to address care coordination needs and will continue to work with the clinical team to address health care and disease management related needs  Follow Up Plan:  The patient has been provided with contact information for the care management team and has been advised to call with any health related questions or concerns.  The care management team will reach out to the patient again over the next 30 business  days.

## 2022-09-25 NOTE — Patient Outreach (Signed)
Medicaid Managed Care   Nurse Care Manager Note  09/25/2022 Name:  Anna Schroeder MRN:  [id number] DOB:  04-Jun-1981  Anna Schroeder is an 41 y.o. year old female who is a primary patient of Pcp, No.  The [facility] Managed Care Coordination team was consulted for assistance with:    Case management needs, left knee pain, h/o anxiety and depression.  Anna Schroeder was given information about Medicaid Managed Care Coordination team services today. Minus Breeding Patient agreed to services and verbal consent obtained.  Engaged with patient by telephone for initial visit in response to provider referral for case management and/or care coordination services.   Assessments/Interventions:  Review of past medical history, allergies, medications, health status, including review of consultants reports, laboratory and other test data, was performed as part of comprehensive evaluation and provision of chronic care management services.  SDOH (Social Determinants of Health) assessments and interventions performed: SDOH Interventions    Flowsheet Row Patient Outreach Telephone from 09/25/2022 in [place] Coordination  SDOH Interventions   Physical Activity Interventions Intervention Not Indicated  [knee pain and muscle spasms]     Care Plan  Allergies  Allergen Reactions   Pineapple Other (See Comments)    Tongue tingles    Medications Reviewed Today     Reviewed by Gayla Medicus, RN (Registered Nurse) on 09/25/22 at 74  Med List Status: <None>   Medication Order Taking? Sig Documenting Provider Last Dose Status Informant  acetaminophen (TYLENOL) 500 MG tablet [id number]  Take 1 tablet (500 mg total) by mouth every 6 (six) hours as needed.  Patient not taking: No sig reported   Gloriann Loan, PA-C  Active Self  bismuth subsalicylate (PEPTO BISMOL) 262 MG/15ML suspension [id number]  Take 30 mLs by mouth as needed for indigestion.  Patient not taking: No sig reported    [provider]  Active Self  cyclobenzaprine (FLEXERIL) 10 MG tablet [id number] Yes TAKE 1 TABLET (10 MG TOTAL) BY MOUTH EVERY 8 (EIGHT) HOURS AS NEEDED FOR MUSCLE SPASMS Shelly Bombard, MD Taking Active   diclofenac Sodium (VOLTAREN) 1 % GEL [id number]  Apply 4 g topically 4 (four) times daily.  Patient not taking: No sig reported   Darr, Edison Nasuti, PA-C  Active   fexofenadine (ALLEGRA) 60 MG tablet [id number]  Take 60 mg by mouth 2 (two) times daily.  Patient not taking: No sig reported   [provider]  Active Self  fluticasone (FLONASE) 50 MCG/ACT nasal spray [id number]  Place 2 sprays into both nostrils daily.  Patient not taking: No sig reported   Palumbo, April, MD  Active Self  hydrocortisone cream 1 % [id number]  Apply to affected area 2 times daily  Patient not taking: No sig reported   Doristine Devoid, PA-C  Active Self  ibuprofen (ADVIL) 600 MG tablet [id number]  Take 1 tablet (600 mg total) by mouth every 6 (six) hours as needed.  Patient not taking: Reported on 09/01/2021   Valarie Merino, MD  Active   ibuprofen (ADVIL) 800 MG tablet [id number]  Take 1 tablet (800 mg total) by mouth 3 (three) times daily.  Patient not taking: No sig reported   Darr, Edison Nasuti, PA-C  Active   meloxicam (MOBIC) 15 MG tablet [id number]  Take 1 tablet (15 mg total) by mouth daily as needed for pain.  Patient not taking: No sig reported   Virgel Manifold, MD  Active   metroNIDAZOLE (FLAGYL) 500 MG tablet  [id number] No Take 1 tablet (500 mg total) by mouth 2 (two) times daily.  Patient not taking: Reported on 09/25/2022   Shelly Bombard, MD Not Taking Active            Patient Active Problem List   Diagnosis Date Noted   Endometriosis determined by laparoscopy 10/30/2014   Pelvic pain in female 09/27/2014   OBESITY, NOS 02/20/2007   Conditions to be addressed/monitored per PCP order:  Case management needs, left knee pain, h/o anxiety and depression.  Care Plan : RN Care  Manager Plan of Care  Updates made by Gayla Medicus, RN since 09/25/2022 12:00 AM     Problem: Health Promotion or Disease Self-Management (General Plan of Care)      Goal: Chronic Disease Management Care Coordination needs   Start Date: 09/25/2022  Expected End Date: 12/26/2022  Priority: High  Note:   Current Barriers:  Knowledge Deficits related to plan of care for management of left knee pain, h/o anxiety and depression  Care Coordination needs related to h/o anxiety and depression Chronic Disease Management support and education needs related to left knee pain and h/o anxiety/depression   RNCM Clinical Goal(s):  Patient will verbalize understanding of plan for management of left knee pain, h/o anxiety and depression as evidenced by patient report verbalize basic understanding of  left knee pain, h/o anxiety/depression disease process and self health management plan as evidenced by patient report take all medications exactly as prescribed and will call provider for medication related questions as evidenced by patient report demonstrate understanding of rationale for each prescribed medication as evidenced by patient report attend all scheduled medical appointments as evidenced by patient report continue to work with RN Care Manager to address care management and care coordination needs related to  left knee pain, h/o anxiety and depression. as evidenced by adherence to CM Team Scheduled appointments work with Education officer, museum to address  related to the management of h/o anxiety and depression  as evidenced by review of EMR and patient or Education officer, museum report through collaboration with Consulting civil engineer, provider, and care team.   Interventions: Inter-disciplinary care team collaboration (see longitudinal plan of care) Evaluation of current treatment plan related to  self management and patient's adherence to plan as established by provider    (Status:  New goal.)  Long Term Goal Evaluation  of current treatment plan related to  left knee pain and h/o anxiety/depression  , self-management and patient's adherence to plan as established by provider. Discussed plans with patient for ongoing care management follow up and provided patient with direct contact information for care management team Evaluation of current treatment plan related to left knee pain, anxiety/depression  and patient's adherence to plan as established by provider Reviewed medications with patient Collaborated with LCSW regarding anxiety and depression. Reviewed scheduled/upcoming provider appointments Social Work referral for anxiety/depression. Discussed plans with patient for ongoing care management follow up and provided patient with direct contact information for care management team Assessed social determinant of health barriers  Patient Goals/Self-Care Activities: Take all medications as prescribed Attend all scheduled provider appointments Call pharmacy for medication refills 3-7 days in advance of running out of medications Perform all self care activities independently  Perform IADL's (shopping, preparing meals, housekeeping, managing finances) independently Call provider office for new concerns or questions  Work with the social worker to address care coordination needs and will continue to work with the clinical team to address health care and disease  management related needs  Follow Up Plan:  The patient has been provided with contact information for the care management team and has been advised to call with any health related questions or concerns.  The care management team will reach out to the patient again over the next 30 business  days.    Long-Range Goal: Establish Plan of Care for Chronic Disease and Care Coordination Needs   Priority: High  Note:   Timeframe:  Long-Range Goal Priority:  High Start Date:  09/25/22                           Expected End Date:ongoing                        Follow Up Date 10/29/22    - schedule appointment for flu shot - schedule appointment for vaccines needed due to my age or health - schedule recommended health tests (blood work, mammogram, colonoscopy, pap test) - schedule and keep appointment for annual check-up    Why is this important?   Screening tests can find diseases early when they are easier to treat.  Your doctor or nurse will talk with you about which tests are important for you.  Getting shots for common diseases like the flu and shingles will help prevent them.     Follow Up:  Patient agrees to Care Plan and Follow-up.  Plan: The Managed Medicaid care management team will reach out to the patient again over the next 30 business  days. and The  Patient has been provided with contact information for the Managed Medicaid care management team and has been advised to call with any health related questions or concerns.  Date/time of next scheduled RN care management/care coordination outreach:  10/29/22 at 1245

## 2022-10-03 ENCOUNTER — Ambulatory Visit: Payer: Self-pay

## 2022-10-04 ENCOUNTER — Other Ambulatory Visit: Payer: Self-pay

## 2022-10-04 NOTE — Patient Outreach (Signed)
  Medicaid Managed Care   Unsuccessful Attempt Note   10/04/2022 Name: Anna Schroeder MRN: [id number] DOB: 10/22/1981  Referred by: Pcp, No Reason for referral : High Risk Managed Medicaid   An unsuccessful telephone outreach was attempted today. The patient was referred to the case management team for assistance with care management and care coordination.    Follow Up Plan: A HIPAA compliant phone message was left for the patient providing contact information and requesting a return call.   Eula Fried, BSW, MSW, [employer] Managed Medicaid LCSW [email or web address] Phone: [phone]

## 2022-10-04 NOTE — Patient Instructions (Signed)
Minus Breeding ,   The [facility] Managed Care Team is available to provide assistance to you with your healthcare needs at no cost and as a benefit of your [facility] plan. I'm sorry I was unable to reach you today for our scheduled appointment. Our care guide will call you to reschedule our telephone appointment. Please call me at the number below. I am available to be of assistance to you regarding your healthcare needs. .   Thank you,   Eula Fried, BSW, MSW, LCSW Managed Medicaid LCSW [email or web address] Phone: [phone]

## 2022-10-05 ENCOUNTER — Telehealth: Payer: Self-pay

## 2022-10-05 NOTE — Telephone Encounter (Signed)
..   Medicaid Managed Care   Unsuccessful Outreach Note  10/05/2022 Name: Anna Schroeder MRN: [id number] DOB: Nov 20, 1981  Referred by: Pcp, No Reason for referral : High Risk Managed Medicaid (I called the patient today to get her rescheduled with the MM LCSW. She did not answer. I left my name and number and a detailed message on her VM.)   A second unsuccessful telephone outreach was attempted today. The patient was referred to the case management team for assistance with care management and care coordination.   Follow Up Plan: The care management team will reach out to the patient again over the next 7 days.    [place]

## 2022-10-09 ENCOUNTER — Telehealth: Payer: Self-pay

## 2022-10-09 NOTE — Telephone Encounter (Signed)
..   Medicaid Managed Care   Unsuccessful Outreach Note  10/09/2022 Name: Anna Schroeder MRN: [id number] DOB: [birth year]  Referred by: Pcp, No Reason for referral : High Risk Managed Medicaid (I called the patient today to get her rescheduled with the MM Team. I left my name and number on her VM.)   A second unsuccessful telephone outreach was attempted today. The patient was referred to the case management team for assistance with care management and care coordination.   Follow Up Plan: The care management team will reach out to the patient again over the next 7 days.    [place]

## 2022-10-16 ENCOUNTER — Telehealth: Payer: Self-pay

## 2022-10-16 NOTE — Telephone Encounter (Signed)
..   Medicaid Managed Care   Unsuccessful Outreach Note  10/16/2022 Name: Anna Schroeder MRN: [id number] DOB: 1981-06-10  Referred by: Pcp, No Reason for referral : High Risk Managed Medicaid (I called the patient today to get her rescheduled with the MM LCSW. She did not answer and I was not able to leave a message.)   Third unsuccessful telephone outreach was attempted today. The patient was referred to the case management team for assistance with care management and care coordination. The patient's primary care provider has been notified of our unsuccessful attempts to make or maintain contact with the patient. The care management team is pleased to engage with this patient at any time in the future should he/she be interested in assistance from the care management team.   Follow Up Plan: We have been unable to make contact with the patient for follow up. The care management team is available to follow up with the patient after provider conversation with the patient regarding recommendation for care management engagement and subsequent re-referral to the care management team.   [place], [place]

## 2022-10-29 ENCOUNTER — Other Ambulatory Visit: Payer: Self-pay | Admitting: Obstetrics and Gynecology

## 2022-10-29 NOTE — Patient Instructions (Signed)
Visit Information  Ms. Anna Schroeder  - as a part of your Medicaid benefit, you are eligible for care management and care coordination services at no cost or copay. I was unable to reach you by phone today but would be happy to help you with your health related needs. Please feel free to call me at [phone].  Aida Raider RN, BSN [place]  [facility] - Managed Medicaid High Risk [phone]

## 2022-10-29 NOTE — Patient Outreach (Signed)
  Medicaid Managed Care   Unsuccessful Outreach Note  10/29/2022 Name: Anna Schroeder MRN: [id number] DOB: 06/06/1981  Referred by: Pcp, No Reason for referral : High Risk Managed Medicaid (Unsuccessful telephone outreach)   Third unsuccessful telephone outreach was attempted .  The patient was referred to the case management team for assistance with care management and care coordination. The patient's primary care provider has been notified of our unsuccessful attempts to make or maintain contact with the patient. The care management team is pleased to engage with this patient at any time in the future should he/she be interested in assistance from the care management team.   Follow Up Plan: We have been unable to make contact with the patient for follow up. The care management team is available to follow up with the patient after provider conversation with the patient regarding recommendation for care management engagement and subsequent re-referral to the care management team.   Aida Raider RN, BSN [place] Management Coordinator - Managed [place] High Risk [phone]

## 2022-11-19 ENCOUNTER — Other Ambulatory Visit: Payer: Self-pay | Admitting: Obstetrics

## 2022-11-19 DIAGNOSIS — Z1231 Encounter for screening mammogram for malignant neoplasm of breast: Secondary | ICD-10-CM

## 2022-11-27 DIAGNOSIS — R350 Frequency of micturition: Secondary | ICD-10-CM | POA: Diagnosis not present

## 2022-11-27 DIAGNOSIS — R109 Unspecified abdominal pain: Secondary | ICD-10-CM | POA: Diagnosis not present

## 2022-12-05 ENCOUNTER — Encounter (INDEPENDENT_AMBULATORY_CARE_PROVIDER_SITE_OTHER): Payer: Self-pay | Admitting: Primary Care

## 2022-12-05 ENCOUNTER — Ambulatory Visit (INDEPENDENT_AMBULATORY_CARE_PROVIDER_SITE_OTHER): Payer: Medicaid Other | Admitting: Primary Care

## 2022-12-05 ENCOUNTER — Other Ambulatory Visit (HOSPITAL_COMMUNITY)
Admission: RE | Admit: 2022-12-05 | Discharge: 2022-12-05 | Disposition: A | Discharge status: Home / Self Care | Payer: Medicaid Other | Source: Ambulatory Visit | Attending: Primary Care | Admitting: Primary Care

## 2022-12-05 VITALS — BP 128/90 | HR 85 | Resp 16 | Ht 62.0 in | Wt 147.0 lb

## 2022-12-05 DIAGNOSIS — Z13220 Encounter for screening for lipoid disorders: Secondary | ICD-10-CM

## 2022-12-05 DIAGNOSIS — Z131 Encounter for screening for diabetes mellitus: Secondary | ICD-10-CM

## 2022-12-05 DIAGNOSIS — N921 Excessive and frequent menstruation with irregular cycle: Secondary | ICD-10-CM

## 2022-12-05 DIAGNOSIS — N898 Other specified noninflammatory disorders of vagina: Secondary | ICD-10-CM | POA: Insufficient documentation

## 2022-12-05 DIAGNOSIS — F411 Generalized anxiety disorder: Secondary | ICD-10-CM | POA: Diagnosis not present

## 2022-12-05 DIAGNOSIS — F331 Major depressive disorder, recurrent, moderate: Secondary | ICD-10-CM

## 2022-12-05 DIAGNOSIS — E663 Overweight: Secondary | ICD-10-CM | POA: Diagnosis not present

## 2022-12-05 DIAGNOSIS — R030 Elevated blood-pressure reading, without diagnosis of hypertension: Secondary | ICD-10-CM | POA: Diagnosis not present

## 2022-12-05 DIAGNOSIS — Z124 Encounter for screening for malignant neoplasm of cervix: Secondary | ICD-10-CM

## 2022-12-05 NOTE — Progress Notes (Signed)
[facility]  WELL-WOMAN PHYSICAL & PAP Patient name: Anna Schroeder MRN [id number]  Date of birth: 05/11/1981 Chief Complaint:   Annual Exam and Gynecologic Exam  History of Present Illness:   Anna Schroeder is a 41 y.o. G3P3003 female being seen today for a routine well-woman exam.    wellness visit   The current method of family planning is none.  Patient's last menstrual period was 10/17/2014. Last pap 10/28/2014. Results were: Negative repeat in 3 years normal Last mammogram: Mammogram is scheduled for 01/11/2023  Family h/o breast cancer: No Family h/o colorectal cancer: No  Review of Systems:    Denies any headaches, blurred vision, fatigue, shortness of breath, chest pain, abdominal pain, abnormal vaginal discharge/itching/odor/irritation, problems with periods, bowel movements, urination, or intercourse unless otherwise stated above.  Pertinent History Reviewed:   Reviewed past medical,surgical, social and family history.  Reviewed problem list, medications and allergies.  Physical Assessment:   Vitals:   12/05/22 1514 12/05/22 1531  BP: (Abnormal) 142/88 (Abnormal) 128/90  Pulse: 85   Resp: 16   SpO2: 100%   Weight: 147 lb (66.7 kg)   Height: 5' 2" (1.575 m)   Body mass index is 26.89 kg/m.        Physical Examination:  General appearance - well appearing, and in no distress Mental status - alert, oriented to person, place, and time Psych:  She has a normal mood and affect Skin - warm and dry, normal color, no suspicious lesions noted Chest - effort normal, all lung fields clear to auscultation bilaterally Heart - normal rate and regular rhythm Neck:  midline trachea, no thyromegaly or nodules Breasts - breasts appear normal, no suspicious masses, no skin or nipple changes or axillary nodes Educated patient on proper self breast examination and had patient to demonstrate SBE. Abdomen - soft, nontender, nondistended, no masses or  organomegaly Pelvic-VULVA: normal appearing vulva with no masses, tenderness or lesions   VAGINA: normal appearing vagina with normal color and discharge, no lesions   CERVIX: normal appearing cervix without discharge or lesions, no CMT UTERUS: uterus is felt to be normal size, shape, consistency and nontender  ADNEXA: No adnexal masses or tenderness noted. Extremities:  No swelling or varicosities noted  No results found for this or any previous visit (from the past 24 hour(s)).   Assessment & Plan:  Anna Schroeder was seen today for annual exam and gynecologic exam.  Diagnoses and all orders for this visit:  Cervical cancer screening -     Cytology - PAP  Generalized anxiety disorder Refer to clinical social worker  Moderate episode of recurrent major depressive disorder (HCC) Refer to clinical social worker  Menorrhagia with irregular cycle Painful irregular menstrual cycles may take 400 mg OTC ibuprofen every 6 8 hours while on cycle Check CBC  Lipid screening -     Lipid Panel  Elevated blood pressure reading without diagnosis of hypertension Discussed preventing hypertension monitoring foods reading labels of sodium content eliminate processed foods and restaurant -     CBC with Differential -     CMP14+EGFR  Vaginal discharge -     Cervicovaginal ancillary only  Diabetes mellitus screening -     Hemoglobin A1c  Overweight(BMI 25-29 ) Discussed diet and exercise for person with BMI >25. Instructed: You must burn more calories than you eat. Losing 5 percent of your body weight should be considered a success. In the longer term, losing more than 15 percent of your body   weight and staying at this weight is an extremely good result. However, keep in mind that even losing 5 percent of your body weight leads to important health benefits, so try not to get discouraged if you're not able to lose more than this. Will recheck weight in 3-6 months.  5 Meds: No orders of the defined  types were placed in this encounter.   Follow-up: No follow-ups on file.  This note has been created with Surveyor, quantity. Any transcriptional errors are unintentional.   Kerin Perna, NP 12/09/2022, 10:26 PM

## 2022-12-06 ENCOUNTER — Encounter (INDEPENDENT_AMBULATORY_CARE_PROVIDER_SITE_OTHER): Payer: Self-pay

## 2022-12-06 ENCOUNTER — Ambulatory Visit: Payer: Self-pay

## 2022-12-06 LAB — LIPID PANEL
Chol/HDL Ratio: 2.3 ratio (ref 0.0–4.4)
Cholesterol, Total: 177 mg/dL (ref 100–199)
HDL: 76 mg/dL (ref >39)
LDL Chol Calc (NIH): 91 mg/dL (ref 0–99)
Triglycerides: 48 mg/dL (ref 0–149)
VLDL Cholesterol Cal: 10 mg/dL (ref 5–40)

## 2022-12-06 LAB — CBC WITH DIFFERENTIAL/PLATELET
Basophils Absolute: 0.1 10*3/uL (ref 0.0–0.2)
Basos: 1 %
EOS (ABSOLUTE): 0.1 10*3/uL (ref 0.0–0.4)
Eos: 1 %
Hematocrit: 40.6 % (ref 34.0–46.6)
Hemoglobin: 14 g/dL (ref 11.1–15.9)
Immature Grans (Abs): 0 10*3/uL (ref 0.0–0.1)
Immature Granulocytes: 0 %
Lymphocytes Absolute: 3.3 10*3/uL — ABNORMAL HIGH (ref 0.7–3.1)
Lymphs: 31 %
MCH: 31.4 pg (ref 26.6–33.0)
MCHC: 34.5 g/dL (ref 31.5–35.7)
MCV: 91 fL (ref 79–97)
Monocytes Absolute: 0.7 10*3/uL (ref 0.1–0.9)
Monocytes: 6 %
Neutrophils Absolute: 6.3 10*3/uL (ref 1.4–7.0)
Neutrophils: 61 %
Platelets: 213 10*3/uL (ref 150–450)
RBC: 4.46 x10E6/uL (ref 3.77–5.28)
RDW: 13.3 % (ref 11.7–15.4)
WBC: 10.4 10*3/uL (ref 3.4–10.8)

## 2022-12-06 LAB — CMP14+EGFR
ALT: 15 IU/L (ref 0–32)
AST: 19 IU/L (ref 0–40)
Albumin/Globulin Ratio: 1.7 (ref 1.2–2.2)
Albumin: 4.3 g/dL (ref 3.9–4.9)
Alkaline Phosphatase: 58 IU/L (ref 44–121)
BUN/Creatinine Ratio: 14 (ref 9–23)
BUN: 11 mg/dL (ref 6–24)
Bilirubin Total: 0.6 mg/dL (ref 0.0–1.2)
CO2: 24 mmol/L (ref 20–29)
Calcium: 9.2 mg/dL (ref 8.7–10.2)
Chloride: 101 mmol/L (ref 96–106)
Creatinine, Ser: 0.81 mg/dL (ref 0.57–1.00)
Globulin, Total: 2.6 g/dL (ref 1.5–4.5)
Glucose: 106 mg/dL — ABNORMAL HIGH (ref 70–99)
Potassium: 4.1 mmol/L (ref 3.5–5.2)
Sodium: 138 mmol/L (ref 134–144)
Total Protein: 6.9 g/dL (ref 6.0–8.5)
eGFR: 93 mL/min/{1.73_m2} (ref >59)

## 2022-12-06 LAB — HEMOGLOBIN A1C
Est. average glucose Bld gHb Est-mCnc: 85 mg/dL
Hgb A1c MFr Bld: 4.6 % — ABNORMAL LOW (ref 4.8–5.6)

## 2022-12-06 NOTE — Telephone Encounter (Signed)
Sharyn Lull returned pt call and made pt aware that is normal. Made pt aware to take ibuprofen, use heating pad and will provide pt a note for work

## 2022-12-06 NOTE — Telephone Encounter (Signed)
     Chief Complaint: Seen yesterday for PAP, having vaginal since exam, cramping. Symptoms: Pain Frequency: Yesterday Pertinent Negatives: Patient denies any discharge Disposition: '[]'$ ED /'[]'$ Urgent Care (no appt availability in office) / '[]'$ Appointment(In office/virtual)/ '[]'$  [place] [facility]/ '[]'$ Home Care/ '[]'$ Refused Recommended Disposition /'[]'$ [facility]/ '[x]'$  Follow-up with PCP Additional Notes: Asking for advice and work note x 2 days. Please advise pt.   Answer Assessment - Initial Assessment Questions 1. SYMPTOM: "What's the main symptom you're concerned about?" (e.g., pain, itching, dryness)     Pain 2. LOCATION: "Where is the  pain located?" (e.g., inside/outside, left/right)     Vagina and cramping 3. ONSET: "When did the  pain  start?"     After exam yesterday 4. PAIN: "Is there any pain?" If Yes, ask: "How bad is it?" (Scale: 1-10; mild, moderate, severe)   -  MILD (1-3): Doesn't interfere with normal activities.    -  MODERATE (4-7): Interferes with normal activities (e.g., work or school) or awakens from sleep.     -  SEVERE (8-10): Excruciating pain, unable to do any normal activities.     Now - 6-7 5. ITCHING: "Is there any itching?" If Yes, ask: "How bad is it?" (Scale: 1-10; mild, moderate, severe)     No 6. CAUSE: "What do you think is causing the discharge?" "Have you had the same problem before? What happened then?"     From exam 7. OTHER SYMPTOMS: "Do you have any other symptoms?" (e.g., fever, itching, vaginal bleeding, pain with urination, injury to genital area, vaginal foreign body)     Mild pain with urination 8. PREGNANCY: "Is there any chance you are pregnant?" "When was your last menstrual period?"     No  Protocols used: Vaginal Symptoms-A-AH

## 2022-12-07 LAB — CERVICOVAGINAL ANCILLARY ONLY
Bacterial Vaginitis (gardnerella): NEGATIVE
Candida Glabrata: NEGATIVE
Candida Vaginitis: NEGATIVE
Chlamydia: NEGATIVE
Comment: NEGATIVE
Comment: NEGATIVE
Comment: NEGATIVE
Comment: NEGATIVE
Comment: NEGATIVE
Comment: NORMAL
Neisseria Gonorrhea: NEGATIVE
Trichomonas: NEGATIVE

## 2022-12-10 LAB — CYTOLOGY - PAP
Adequacy: ABSENT
Comment: NEGATIVE
Diagnosis: NEGATIVE
High risk HPV: NEGATIVE

## 2023-01-11 ENCOUNTER — Ambulatory Visit
Admission: RE | Admit: 2023-01-11 | Discharge: 2023-01-11 | Disposition: A | Discharge status: Home / Self Care | Payer: Medicaid Other | Source: Ambulatory Visit | Attending: Obstetrics | Admitting: Obstetrics

## 2023-01-11 DIAGNOSIS — Z1231 Encounter for screening mammogram for malignant neoplasm of breast: Secondary | ICD-10-CM

## 2023-05-14 ENCOUNTER — Other Ambulatory Visit: Payer: Self-pay | Admitting: Obstetrics

## 2023-05-14 DIAGNOSIS — M545 Low back pain, unspecified: Secondary | ICD-10-CM

## 2023-12-06 ENCOUNTER — Ambulatory Visit (INDEPENDENT_AMBULATORY_CARE_PROVIDER_SITE_OTHER): Payer: Medicaid Other | Admitting: Primary Care

## 2023-12-06 ENCOUNTER — Other Ambulatory Visit (HOSPITAL_COMMUNITY)
Admission: RE | Admit: 2023-12-06 | Discharge: 2023-12-06 | Disposition: A | Discharge status: Home / Self Care | Payer: Medicaid Other | Source: Ambulatory Visit | Attending: Primary Care | Admitting: Primary Care

## 2023-12-06 ENCOUNTER — Encounter (INDEPENDENT_AMBULATORY_CARE_PROVIDER_SITE_OTHER): Payer: Self-pay | Admitting: Primary Care

## 2023-12-06 VITALS — BP 145/88 | HR 84 | Resp 16

## 2023-12-06 DIAGNOSIS — I10 Essential (primary) hypertension: Secondary | ICD-10-CM | POA: Diagnosis not present

## 2023-12-06 DIAGNOSIS — Z113 Encounter for screening for infections with a predominantly sexual mode of transmission: Secondary | ICD-10-CM | POA: Diagnosis not present

## 2023-12-06 DIAGNOSIS — Z5912 Inadequate housing utilities: Secondary | ICD-10-CM | POA: Diagnosis not present

## 2023-12-06 DIAGNOSIS — F331 Major depressive disorder, recurrent, moderate: Secondary | ICD-10-CM | POA: Insufficient documentation

## 2023-12-06 DIAGNOSIS — Z2821 Immunization not carried out because of patient refusal: Secondary | ICD-10-CM | POA: Diagnosis not present

## 2023-12-06 DIAGNOSIS — Z5941 Food insecurity: Secondary | ICD-10-CM

## 2023-12-06 DIAGNOSIS — R5383 Other fatigue: Secondary | ICD-10-CM | POA: Diagnosis not present

## 2023-12-06 DIAGNOSIS — F32A Depression, unspecified: Secondary | ICD-10-CM | POA: Diagnosis not present

## 2023-12-06 DIAGNOSIS — F411 Generalized anxiety disorder: Secondary | ICD-10-CM | POA: Diagnosis not present

## 2023-12-06 MED ORDER — HYDROCHLOROTHIAZIDE 12.5 MG PO CAPS: 12.5000 mg | ORAL_CAPSULE | Freq: Every day | ORAL | 1 refills | Status: DC

## 2023-12-06 NOTE — Patient Instructions (Signed)
 Hydrochlorothiazide Capsules or Tablets What is this medication? HYDROCHLOROTHIAZIDE (hye droe klor oh THYE a zide) treats high blood pressure. It may also be used to reduce swelling related to heart, kidney, or liver disease. It helps your kidneys remove more fluid and salt from your blood through the urine. It belongs to a group of medications called diuretics. This medicine may be used for other purposes; ask your health care provider or pharmacist if you have questions. COMMON BRAND NAME(S): Esidrix, Ezide, HydroDIURIL, Microzide, Oretic, Zide What should I tell my care team before I take this medication? They need to know if you have any of these conditions: Diabetes Gout Kidney disease Liver disease Lupus Pancreatitis An unusual or allergic reaction to hydrochlorothiazide, other medications, foods, dyes, or preservatives Pregnant or trying to get pregnant Breastfeeding How should I use this medication? Take this medication by mouth. Take it as directed on the prescription label at the same time every day. You can take it with or without food. If it upsets your stomach, take it with food. Keep taking it unless your care team tells you to stop. Talk to your care team about the use of this medication in children. While it may be prescribed for children as young as newborns for selected conditions, precautions do apply. Overdosage: If you think you have taken too much of this medicine contact a poison control center or emergency room at once. NOTE: This medicine is only for you. Do not share this medicine with others. What if I miss a dose? If you miss a dose, take it as soon as you can. If it is almost time for your next dose, take only that dose. Do not take double or extra doses. What may interact with this medication? Cholestyramine Colestipol Digoxin Dofetilide Lithium Medications for blood pressure Medications for diabetes Medications that relax muscles for surgery Other  diuretics Steroid medications, such as prednisone or cortisone This list may not describe all possible interactions. Give your health care provider a list of all the medicines, herbs, non-prescription drugs, or dietary supplements you use. Also tell them if you smoke, drink alcohol, or use illegal drugs. Some items may interact with your medicine. What should I watch for while using this medication? Visit your care team for regular checks on your progress. Check your blood pressure as directed. Know what your blood pressure should be and when to contact your care team. Do not treat yourself for coughs, colds, or pain while you are using this medication without asking your care team for advice. Some medications may increase your blood pressure. This medication may affect your coordination, reaction time, or judgment. Do not drive or operate machinery until you know how this medication affects you. Sit up or stand slowly to reduce the risk of dizzy or fainting spells. Drinking alcohol with this medication can increase the risk of these side effects. Talk to your care team about your risk of skin cancer. You may be more at risk for skin cancer if you take this medication. This medication can make you more sensitive to the sun. Keep out of the sun. If you cannot avoid being in the sun, wear protective clothing and use sunscreen. Do not use sun lamps or tanning beds/booths. You may need to be on a special diet while taking this medication. Ask your care team. Also, find out how many glasses of fluids you need to drink each day. Check with your care team if you get an attack of severe diarrhea,  nausea and vomiting, or if you sweat a lot. The loss of too much body fluid can make it dangerous for you to take this medication. This medication may increase blood sugar. Ask your care team if changes in diet or medications are needed if you have diabetes. What side effects may I notice from receiving this  medication? Side effects that you should report to your care team as soon as possible: Allergic reactions--skin rash, itching, hives, swelling of the face, lips, tongue, or throat Dehydration--increased thirst, dry mouth, feeling faint or lightheaded, headache, dark yellow or brown urine Gout--severe pain, redness, warmth, or swelling in joints, such as the big toe Kidney injury--decrease in the amount of urine, swelling of the ankles, hands, or feet Low blood pressure--dizziness, feeling faint or lightheaded, blurry vision Low potassium level--muscle pain or cramps, unusual weakness, fatigue, fast or irregular heartbeat, constipation Sudden eye pain or change in vision such as blurred vision, seeing halos around lights, vision loss Side effects that usually do not require medical attention (report to your care team if they continue or are bothersome): Change in sex drive or performance Headache Upset stomach This list may not describe all possible side effects. Call your doctor for medical advice about side effects. You may report side effects to FDA at 1-800-FDA-1088. Where should I keep my medication? Keep out of the reach of children and pets. Store at room temperature between 20 and 25 degrees C (68 and 77 degrees F). Protect from light and moisture. Keep the container tightly closed. Do not freeze. Get rid of any unused medication after the expiration date. To get rid of medications that are no longer needed or have expired: Take the medication to a medication take-back program. Check with your pharmacy or law enforcement to find a location. If you cannot return the medication, check the label or package insert to see if the medication should be thrown out in the garbage or flushed down the toilet. If you are not sure, ask your care team. If it is safe to put in the trash, empty the medication out of the container. Mix the medication with cat litter, dirt, coffee grounds, or other unwanted  substance. Seal the mixture in a bag or container. Put it in the trash. NOTE: This sheet is a summary. It may not cover all possible information. If you have questions about this medicine, talk to your doctor, pharmacist, or health care provider.  2024 [employer]vier/Gold Standard (2022-07-06 00:00:00)

## 2023-12-06 NOTE — Progress Notes (Unsigned)
[facility]  Anna Schroeder is a 42 y.o. female presents to office today for annual physical exam examination.    Concerns today include: 1. Vaginal discharged   Occupation: Production designer, theatre/television/film, Marital status: D, Substance use: N Diet: N, Exercise: y  Health Maintenance  Topic Date Due   COVID-19 Vaccine (3 - 2024-25 season) 08/25/2023   INFLUENZA VACCINE  03/23/2024 (Originally 07/25/2023)   DTaP/Tdap/Td (3 - Td or Tdap) 05/31/2026   Hepatitis C Screening  Completed   HIV Screening  Completed   HPV VACCINES  Aged Out     Past Medical History:  Diagnosis Date   BV (bacterial vaginosis)    Chlamydia    Family history of anesthesia complication    parents were slow to wake up   Fibroids    Pelvic mass    Vaginal yeast infection    Social History   Socioeconomic History   Marital status: Media planner    Spouse name: Not on file   Number of children: Not on file   Years of education: Not on file   Highest education level: Not on file  Occupational History   Not on file  Tobacco Use   Smoking status: Former    Current packs/day: 0.00    Types: Cigarettes, Cigars    Quit date: 10/15/2015    Years since quitting: 8.1   Smokeless tobacco: Never  Vaping Use   Vaping status: Never Used  Substance and Sexual Activity   Alcohol use: Yes    Comment: rare   Drug use: Yes    Types: Marijuana    Comment: once per month   Sexual activity: Yes    Partners: Male    Birth control/protection: Surgical  Other Topics Concern   Not on file  Social History Narrative   Not on file   Social Drivers of Health   Financial Resource Strain: Not on file  Food Insecurity: Not on file  Transportation Needs: Not on file  Physical Activity: Insufficiently Active (09/25/2022)   Exercise Vital Sign    Days of Exercise per Week: 3 days    Minutes of Exercise per Session: 30 min  Stress: Not on file  Social Connections: Not on file  Intimate Partner Violence: Not At Risk  (09/25/2022)   Humiliation, Afraid, Rape, and Kick questionnaire    Fear of Current or Ex-Partner: No    Emotionally Abused: No    Physically Abused: No    Sexually Abused: No   Past Surgical History:  Procedure Laterality Date   CESAREAN SECTION     ROBOTIC ASSISTED TOTAL HYSTERECTOMY N/A 10/29/2014   Procedure: ROBOTIC ASSISTED TOTAL HYSTERECTOMY, LEFT SALPINGOOOPHERECTOMY, RIGHT SALPINGECTOMY;  Surgeon: Antionette Char, MD;  Location: [facility];  Service: Gynecology;  Laterality: N/A;   TUBAL LIGATION     Family History  Problem Relation Age of Onset   Hypertension Mother    Heart disease Mother    Arthritis Mother    Alcohol abuse Father    Arthritis Father    Asthma Father    Diabetes Father    Drug abuse Father    Heart disease Father    Hyperlipidemia Father    Hypertension Father    Kidney disease Father    Stroke Father     Current Outpatient Medications:    acetaminophen (TYLENOL) 500 MG tablet, Take 1 tablet (500 mg total) by mouth every 6 (six) hours as needed. (Patient not taking: Reported on 05/07/2017), Disp: 30 tablet, Rfl:  0   bismuth subsalicylate (PEPTO BISMOL) 262 MG/15ML suspension, Take 30 mLs by mouth as needed for indigestion. (Patient not taking: Reported on 08/31/2020), Disp: , Rfl:    cyclobenzaprine (FLEXERIL) 10 MG tablet, TAKE 1 TABLET (10 MG TOTAL) BY MOUTH EVERY 8 (EIGHT) HOURS AS NEEDED FOR MUSCLE SPASMS, Disp: 30 tablet, Rfl: 2   diclofenac Sodium (VOLTAREN) 1 % GEL, Apply 4 g topically 4 (four) times daily. (Patient not taking: Reported on 08/31/2020), Disp: 100 g, Rfl: 0   fexofenadine (ALLEGRA) 60 MG tablet, Take 60 mg by mouth 2 (two) times daily. (Patient not taking: Reported on 08/31/2020), Disp: , Rfl:    fluticasone (FLONASE) 50 MCG/ACT nasal spray, Place 2 sprays into both nostrils daily. (Patient not taking: Reported on 11/27/2017), Disp: 16 g, Rfl: 0   hydrocortisone cream 1 %, Apply to affected area 2 times daily (Patient not taking: Reported  on 07/26/2018), Disp: 15 g, Rfl: 0   ibuprofen (ADVIL) 600 MG tablet, Take 1 tablet (600 mg total) by mouth every 6 (six) hours as needed. (Patient not taking: Reported on 09/01/2021), Disp: 30 tablet, Rfl: 0   ibuprofen (ADVIL) 800 MG tablet, Take 1 tablet (800 mg total) by mouth 3 (three) times daily. (Patient not taking: Reported on 08/31/2020), Disp: 21 tablet, Rfl: 0   meloxicam (MOBIC) 15 MG tablet, Take 1 tablet (15 mg total) by mouth daily as needed for pain. (Patient not taking: Reported on 08/31/2020), Disp: 10 tablet, Rfl: 0   metroNIDAZOLE (FLAGYL) 500 MG tablet, Take 1 tablet (500 mg total) by mouth 2 (two) times daily. (Patient not taking: Reported on 09/25/2022), Disp: 14 tablet, Rfl: 2 Outpatient Encounter Medications as of 12/06/2023  Medication Sig   acetaminophen (TYLENOL) 500 MG tablet Take 1 tablet (500 mg total) by mouth every 6 (six) hours as needed. (Patient not taking: Reported on 05/07/2017)   bismuth subsalicylate (PEPTO BISMOL) 262 MG/15ML suspension Take 30 mLs by mouth as needed for indigestion. (Patient not taking: Reported on 08/31/2020)   cyclobenzaprine (FLEXERIL) 10 MG tablet TAKE 1 TABLET (10 MG TOTAL) BY MOUTH EVERY 8 (EIGHT) HOURS AS NEEDED FOR MUSCLE SPASMS   diclofenac Sodium (VOLTAREN) 1 % GEL Apply 4 g topically 4 (four) times daily. (Patient not taking: Reported on 08/31/2020)   fexofenadine (ALLEGRA) 60 MG tablet Take 60 mg by mouth 2 (two) times daily. (Patient not taking: Reported on 08/31/2020)   fluticasone (FLONASE) 50 MCG/ACT nasal spray Place 2 sprays into both nostrils daily. (Patient not taking: Reported on 11/27/2017)   hydrocortisone cream 1 % Apply to affected area 2 times daily (Patient not taking: Reported on 07/26/2018)   ibuprofen (ADVIL) 600 MG tablet Take 1 tablet (600 mg total) by mouth every 6 (six) hours as needed. (Patient not taking: Reported on 09/01/2021)   ibuprofen (ADVIL) 800 MG tablet Take 1 tablet (800 mg total) by mouth 3 (three) times daily.  (Patient not taking: Reported on 08/31/2020)   meloxicam (MOBIC) 15 MG tablet Take 1 tablet (15 mg total) by mouth daily as needed for pain. (Patient not taking: Reported on 08/31/2020)   metroNIDAZOLE (FLAGYL) 500 MG tablet Take 1 tablet (500 mg total) by mouth 2 (two) times daily. (Patient not taking: Reported on 09/25/2022)   No facility-administered encounter medications on file as of 12/06/2023.    Allergies  Allergen Reactions   Pineapple Other (See Comments)    Tongue tingles     ROS: Review of Systems Pertinent items are noted in HPI.  Physical exam Physical exam: General: Vital signs reviewed.  Patient is well-developed and well-nourished, xx in no acute distress and cooperative with exam. Head: Normocephalic and atraumatic. Eyes: EOMI, conjunctivae normal, no scleral icterus. Neck: Supple, trachea midline, normal ROM, no JVD, masses, thyromegaly, or carotid bruit present. Cardiovascular: RRR, S1 normal, S2 normal, no murmurs, gallops, or rubs. Pulmonary/Chest: Clear to auscultation bilaterally, no wheezes, rales, or rhonchi. Abdominal: Soft, non-tender, non-distended, BS +, no masses, organomegaly, or guarding present. Musculoskeletal: No joint deformities, erythema, or stiffness, ROM full and nontender. Extremities: No lower extremity edema bilaterally,  pulses symmetric and intact bilaterally. No cyanosis or clubbing. Neurological: A&O x3, Strength is normal Skin: Warm, dry and intact. No rashes or erythema. Psychiatric: Normal mood and affect. speech and behavior is normal. Cognition and memory are normal.     Assessment/ Plan: Anna Schroeder was seen today for annual exam.  Diagnoses and all orders for this visit:  Screening for STD (sexually transmitted disease) -     Cancel: Cervicovaginal ancillary only -     HIV antibody (with reflex) -     Cervicovaginal ancillary only  Influenza vaccination declined  Essential hypertension BP goal - < 130/80 Explained that  having normal blood pressure is the goal and medications are helping to get to goal and maintain normal blood pressure. DIET: Limit salt intake, read nutrition labels to check salt content, limit fried and high fatty foods  Avoid using multisymptom OTC cold preparations that generally contain sudafed which can rise BP. Consult with pharmacist on best cold relief products to use for persons with HTN EXERCISE Discussed incorporating exercise such as walking - 30 minutes most days of the week and can do in 10 minute intervals    -     CMP14+EGFR   Generalized anxiety disorder 2/2 Fatigue due to depression -     CBC with Differential/Platelet -     CMP14+EGFR        Counseled on healthy lifestyle choices, including diet (rich in fruits, vegetables and lean meats and low in salt and simple carbohydrates) and exercise (at least 30 minutes of moderate physical activity daily).  Patient to follow up in 1 year for annual exam or sooner if needed.  The above assessment and management plan was discussed with the patient. The patient verbalized understanding of and has agreed to the management plan. Patient is aware to call the clinic if symptoms persist or worsen. Patient is aware when to return to the clinic for a follow-up visit. Patient educated on when it is appropriate to go to the emergency department.   This note has been created with Education officer, environmental. Any transcriptional errors are unintentional.   Grayce Sessions, NP 12/06/2023, 9:50 AM

## 2023-12-10 ENCOUNTER — Ambulatory Visit (INDEPENDENT_AMBULATORY_CARE_PROVIDER_SITE_OTHER): Payer: Medicaid Other

## 2023-12-11 LAB — CERVICOVAGINAL ANCILLARY ONLY
Bacterial Vaginitis (gardnerella): NEGATIVE
Chlamydia: NEGATIVE
Comment: NEGATIVE
Comment: NEGATIVE
Comment: NORMAL
Neisseria Gonorrhea: NEGATIVE

## 2023-12-11 LAB — CBC WITH DIFFERENTIAL/PLATELET

## 2023-12-12 LAB — CMP14+EGFR
ALT: 22 [IU]/L (ref 0–32)
AST: 23 [IU]/L (ref 0–40)
Albumin: 4.5 g/dL (ref 3.9–4.9)
Alkaline Phosphatase: 60 [IU]/L (ref 44–121)
BUN/Creatinine Ratio: 13 (ref 9–23)
BUN: 12 mg/dL (ref 6–24)
Bilirubin Total: 0.7 mg/dL (ref 0.0–1.2)
CO2: 25 mmol/L (ref 20–29)
Calcium: 9.2 mg/dL (ref 8.7–10.2)
Chloride: 103 mmol/L (ref 96–106)
Creatinine, Ser: 0.9 mg/dL (ref 0.57–1.00)
Globulin, Total: 2.7 g/dL (ref 1.5–4.5)
Glucose: 74 mg/dL (ref 70–99)
Potassium: 3.9 mmol/L (ref 3.5–5.2)
Sodium: 139 mmol/L (ref 134–144)
Total Protein: 7.2 g/dL (ref 6.0–8.5)
eGFR: 81 mL/min/{1.73_m2} (ref >59)

## 2023-12-12 LAB — CBC WITH DIFFERENTIAL/PLATELET
Basos: 1 %
EOS (ABSOLUTE): 0.1 10*3/uL (ref 0.0–0.2)
Eos: 2 %
Hematocrit: 40.8 % (ref 34.0–46.6)
Hemoglobin: 14.3 g/dL (ref 11.1–15.9)
Immature Granulocytes: 0 %
Immature Granulocytes: 0 10*3/uL (ref 0.0–0.1)
Lymphs: 31 %
MCH: 31.8 pg (ref 26.6–33.0)
MCHC: 35 g/dL (ref 31.5–35.7)
MCV: 91 fL (ref 79–97)
Monocytes Absolute: 0.2 10*3/uL (ref 0.0–0.4)
Monocytes Absolute: 0.7 10*3/uL (ref 0.1–0.9)
Monocytes: 7 %
Neutrophils Absolute: 2.8 10*3/uL (ref 0.7–3.1)
Neutrophils Absolute: 5.5 10*3/uL (ref 1.4–7.0)
Neutrophils: 59 %
Platelets: 247 10*3/uL (ref 150–450)
RBC: 4.49 x10E6/uL (ref 3.77–5.28)
RDW: 13.2 % (ref 11.7–15.4)
WBC: 9.2 10*3/uL (ref 3.4–10.8)

## 2023-12-12 LAB — HIV ANTIBODY (ROUTINE TESTING W REFLEX): HIV Screen 4th Generation wRfx: NONREACTIVE

## 2023-12-27 ENCOUNTER — Encounter (INDEPENDENT_AMBULATORY_CARE_PROVIDER_SITE_OTHER): Payer: Self-pay | Admitting: Primary Care

## 2023-12-27 ENCOUNTER — Ambulatory Visit (INDEPENDENT_AMBULATORY_CARE_PROVIDER_SITE_OTHER): Payer: Medicaid Other

## 2023-12-31 ENCOUNTER — Ambulatory Visit (INDEPENDENT_AMBULATORY_CARE_PROVIDER_SITE_OTHER): Payer: Self-pay

## 2023-12-31 DIAGNOSIS — J101 Influenza due to other identified influenza virus with other respiratory manifestations: Secondary | ICD-10-CM | POA: Diagnosis not present

## 2023-12-31 DIAGNOSIS — M791 Myalgia, unspecified site: Secondary | ICD-10-CM | POA: Diagnosis not present

## 2023-12-31 DIAGNOSIS — T59811A Toxic effect of smoke, accidental (unintentional), initial encounter: Secondary | ICD-10-CM | POA: Diagnosis not present

## 2023-12-31 DIAGNOSIS — Z20822 Contact with and (suspected) exposure to covid-19: Secondary | ICD-10-CM | POA: Diagnosis not present
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
## 2024-01-02 ENCOUNTER — Encounter (INDEPENDENT_AMBULATORY_CARE_PROVIDER_SITE_OTHER): Payer: Self-pay

## 2024-01-02 ENCOUNTER — Ambulatory Visit (INDEPENDENT_AMBULATORY_CARE_PROVIDER_SITE_OTHER): Payer: Medicaid Other

## 2024-01-02 ENCOUNTER — Encounter (INDEPENDENT_AMBULATORY_CARE_PROVIDER_SITE_OTHER): Payer: Self-pay | Admitting: Primary Care

## 2024-03-05 ENCOUNTER — Ambulatory Visit (INDEPENDENT_AMBULATORY_CARE_PROVIDER_SITE_OTHER): Payer: Self-pay | Admitting: Primary Care

## 2024-09-02 DIAGNOSIS — S71051A Open bite, right hip, initial encounter: Secondary | ICD-10-CM | POA: Diagnosis not present

## 2024-09-02 DIAGNOSIS — S71101A Unspecified open wound, right thigh, initial encounter: Secondary | ICD-10-CM | POA: Diagnosis not present

## 2024-10-27 ENCOUNTER — Other Ambulatory Visit: Payer: Self-pay | Admitting: Obstetrics

## 2024-10-27 DIAGNOSIS — G8929 Other chronic pain: Secondary | ICD-10-CM

## 2025-01-05 ENCOUNTER — Other Ambulatory Visit: Payer: Self-pay | Admitting: Primary Care

## 2025-01-05 DIAGNOSIS — Z1231 Encounter for screening mammogram for malignant neoplasm of breast: Secondary | ICD-10-CM

## 2025-01-13 ENCOUNTER — Telehealth (INDEPENDENT_AMBULATORY_CARE_PROVIDER_SITE_OTHER): Payer: Self-pay | Admitting: Primary Care

## 2025-01-13 NOTE — Telephone Encounter (Signed)
 Called pt to confirm appt. Pt did not answer and LVM. Please advise.

## 2025-01-14 ENCOUNTER — Encounter

## 2025-01-14 ENCOUNTER — Ambulatory Visit (INDEPENDENT_AMBULATORY_CARE_PROVIDER_SITE_OTHER): Admitting: Primary Care

## 2025-01-14 ENCOUNTER — Encounter (INDEPENDENT_AMBULATORY_CARE_PROVIDER_SITE_OTHER): Payer: Self-pay | Admitting: Primary Care

## 2025-01-14 VITALS — BP 139/87 | HR 74 | Temp 98.1°F | Resp 16 | Ht 62.0 in | Wt 139.4 lb

## 2025-01-14 DIAGNOSIS — Z1211 Encounter for screening for malignant neoplasm of colon: Secondary | ICD-10-CM

## 2025-01-14 DIAGNOSIS — F32A Depression, unspecified: Secondary | ICD-10-CM

## 2025-01-14 DIAGNOSIS — R5383 Other fatigue: Secondary | ICD-10-CM

## 2025-01-14 DIAGNOSIS — Z13220 Encounter for screening for lipoid disorders: Secondary | ICD-10-CM | POA: Diagnosis not present

## 2025-01-14 DIAGNOSIS — Z1231 Encounter for screening mammogram for malignant neoplasm of breast: Secondary | ICD-10-CM | POA: Diagnosis not present

## 2025-01-14 DIAGNOSIS — I10 Essential (primary) hypertension: Secondary | ICD-10-CM | POA: Diagnosis not present

## 2025-01-14 DIAGNOSIS — Z0000 Encounter for general adult medical examination without abnormal findings: Secondary | ICD-10-CM

## 2025-01-14 MED ORDER — HYDROCHLOROTHIAZIDE 12.5 MG PO CAPS: 12.5000 mg | ORAL_CAPSULE | Freq: Every day | ORAL | 1 refills | Status: AC
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
## 2025-01-15 LAB — CMP14+EGFR
ALT: 15 IU/L (ref 0–32)
AST: 20 IU/L (ref 0–40)
Albumin: 4.3 g/dL (ref 3.9–4.9)
Alkaline Phosphatase: 54 IU/L (ref 41–116)
BUN/Creatinine Ratio: 15 (ref 9–23)
BUN: 11 mg/dL (ref 6–24)
Bilirubin Total: 0.8 mg/dL (ref 0.0–1.2)
CO2: 19 mmol/L — ABNORMAL LOW (ref 20–29)
Calcium: 9 mg/dL (ref 8.7–10.2)
Chloride: 102 mmol/L (ref 96–106)
Creatinine, Ser: 0.71 mg/dL (ref 0.57–1.00)
Globulin, Total: 2.8 g/dL (ref 1.5–4.5)
Glucose: 85 mg/dL (ref 70–99)
Potassium: 4.6 mmol/L (ref 3.5–5.2)
Sodium: 134 mmol/L (ref 134–144)
Total Protein: 7.1 g/dL (ref 6.0–8.5)
eGFR: 107 mL/min/1.73

## 2025-01-15 LAB — CBC WITH DIFFERENTIAL/PLATELET
Basophils Absolute: 0 x10E3/uL (ref 0.0–0.2)
Basos: 1 %
EOS (ABSOLUTE): 0.1 x10E3/uL (ref 0.0–0.4)
Eos: 2 %
Hematocrit: 42.4 % (ref 34.0–46.6)
Hemoglobin: 14.6 g/dL (ref 11.1–15.9)
Immature Grans (Abs): 0 x10E3/uL (ref 0.0–0.1)
Immature Granulocytes: 0 %
Lymphocytes Absolute: 2.5 x10E3/uL (ref 0.7–3.1)
Lymphs: 30 %
MCH: 30.6 pg (ref 26.6–33.0)
MCHC: 34.4 g/dL (ref 31.5–35.7)
MCV: 89 fL (ref 79–97)
Monocytes Absolute: 0.6 x10E3/uL (ref 0.1–0.9)
Monocytes: 7 %
Neutrophils Absolute: 5.1 x10E3/uL (ref 1.4–7.0)
Neutrophils: 60 %
Platelets: 221 x10E3/uL (ref 150–450)
RBC: 4.77 x10E6/uL (ref 3.77–5.28)
RDW: 13.8 % (ref 11.7–15.4)
WBC: 8.4 x10E3/uL (ref 3.4–10.8)

## 2025-01-15 LAB — LIPID PANEL
Chol/HDL Ratio: 2.4 ratio (ref 0.0–4.4)
Cholesterol, Total: 184 mg/dL (ref 100–199)
HDL: 77 mg/dL
LDL Chol Calc (NIH): 96 mg/dL (ref 0–99)
Triglycerides: 56 mg/dL (ref 0–149)
VLDL Cholesterol Cal: 11 mg/dL (ref 5–40)

## 2025-01-19 ENCOUNTER — Ambulatory Visit (INDEPENDENT_AMBULATORY_CARE_PROVIDER_SITE_OTHER): Payer: Self-pay | Admitting: Primary Care

## 2025-01-20 ENCOUNTER — Encounter (INDEPENDENT_AMBULATORY_CARE_PROVIDER_SITE_OTHER): Admitting: Primary Care
